# Patient Record
Sex: Male | Born: 1980 | Race: White | Hispanic: No | Smoking: Current every day smoker
Health system: Southern US, Community
[De-identification: ages and names within clinical notes are randomized; demographics above are authoritative.]

## PROBLEM LIST (undated history)

## (undated) DIAGNOSIS — L409 Psoriasis, unspecified: Secondary | ICD-10-CM

## (undated) DIAGNOSIS — F1910 Other psychoactive substance abuse, uncomplicated: Secondary | ICD-10-CM

## (undated) DIAGNOSIS — N200 Calculus of kidney: Secondary | ICD-10-CM

## (undated) DIAGNOSIS — F1020 Alcohol dependence, uncomplicated: Secondary | ICD-10-CM

## (undated) HISTORY — PX: LITHOTRIPSY: SUR834

---

## 2014-06-02 ENCOUNTER — Emergency Department: Payer: Self-pay | Admitting: Emergency Medicine

## 2014-08-31 ENCOUNTER — Emergency Department (HOSPITAL_COMMUNITY)
Admission: EM | Admit: 2014-08-31 | Discharge: 2014-08-31 | Disposition: A | Discharge status: Home / Self Care | Payer: Self-pay | Attending: Emergency Medicine | Admitting: Emergency Medicine

## 2014-08-31 ENCOUNTER — Emergency Department (HOSPITAL_COMMUNITY): Payer: Self-pay

## 2014-08-31 ENCOUNTER — Encounter (HOSPITAL_COMMUNITY): Payer: Self-pay | Admitting: Emergency Medicine

## 2014-08-31 DIAGNOSIS — R059 Cough, unspecified: Secondary | ICD-10-CM

## 2014-08-31 DIAGNOSIS — R1110 Vomiting, unspecified: Secondary | ICD-10-CM | POA: Insufficient documentation

## 2014-08-31 DIAGNOSIS — Z872 Personal history of diseases of the skin and subcutaneous tissue: Secondary | ICD-10-CM | POA: Insufficient documentation

## 2014-08-31 DIAGNOSIS — Z880 Allergy status to penicillin: Secondary | ICD-10-CM | POA: Insufficient documentation

## 2014-08-31 DIAGNOSIS — J40 Bronchitis, not specified as acute or chronic: Secondary | ICD-10-CM

## 2014-08-31 DIAGNOSIS — Z720 Tobacco use: Secondary | ICD-10-CM | POA: Insufficient documentation

## 2014-08-31 DIAGNOSIS — J209 Acute bronchitis, unspecified: Secondary | ICD-10-CM | POA: Insufficient documentation

## 2014-08-31 DIAGNOSIS — R05 Cough: Secondary | ICD-10-CM

## 2014-08-31 HISTORY — DX: Psoriasis, unspecified: L40.9

## 2014-08-31 MED ORDER — ALBUTEROL SULFATE HFA 108 (90 BASE) MCG/ACT IN AERS
2.0000 | INHALATION_SPRAY | RESPIRATORY_TRACT | Status: DC | PRN
  Administered 2014-08-31: 2 via RESPIRATORY_TRACT
  Filled 2014-08-31: qty 6.7

## 2014-08-31 MED ORDER — AZITHROMYCIN 250 MG PO TABS: 250.0000 mg | ORAL_TABLET | Freq: Every day | ORAL | Status: DC

## 2014-08-31 MED ORDER — AZITHROMYCIN 250 MG PO TABS
500.0000 mg | ORAL_TABLET | Freq: Once | ORAL | Status: AC
  Administered 2014-08-31: 500 mg via ORAL
  Filled 2014-08-31: qty 2

## 2014-08-31 MED ORDER — GUAIFENESIN-CODEINE 100-10 MG/5ML PO SOLN: 5.0000 mL | Freq: Three times a day (TID) | ORAL | Status: DC | PRN

## 2014-08-31 NOTE — ED Notes (Signed)
Pt reports dry cough x 2 weeks; reports increased pain in chest when coughing. Cough is nonproductive; reports feeling like phlegm is in chest and throat, unable to cough phlegm up.

## 2014-08-31 NOTE — ED Notes (Signed)
Pt c/o cough and pain with cough x 1 week

## 2014-08-31 NOTE — ED Provider Notes (Signed)
CSN: [id number]     Arrival date & time 08/31/14  1815 History  This chart was scribed for Richard Peliffany Draysen Weygandt, PA-C, working with Richard DivineJohn Wofford, MD by Richard Dillon, ED Scribe. This patient was seen in room TR03C/TR03C and the patient's care was started at 7:15 PM.   Chief Complaint  Patient presents with  . Cough   The history is provided by the patient. No language interpreter was used.   HPI Comments:  Richard Dillon is a 34 y.o. male who presents to the Emergency Department complaining of a dry cough onset two weeks ago that was originally intermittent but has become constant with exertion and at night.  He reports a sensation of phlegm in his throat that he cannot clear with coughing. He also notes associated chest pain that initially resolved when he would cease to cough, but is now present without coughing- worsens with movement and if he pushes on his chest.  He also notes some recent onset mild sore throat and 1 episode of post-tussive emesis today.  OTC allergy and cough medications have not afforded relief.  Patient denies having the flu shot.  Patient denies a history of asthma.  Patient is a current smoker. Patient denies fever, headache, ear pain, generalized body aches. .     Past Medical History  Diagnosis Date  . Psoriasis    History reviewed. No pertinent past surgical history. History reviewed. No pertinent family history. History  Substance Use Topics  . Smoking status: Current Every Day Smoker  . Smokeless tobacco: Not on file  . Alcohol Use: Yes    Review of Systems  Constitutional: Negative for fever.  HENT: Positive for sore throat. Negative for ear pain.   Respiratory: Positive for cough.   Gastrointestinal: Positive for vomiting.  Neurological: Negative for headaches.   Allergies  Amoxicillin and Prednisone  Home Medications   Prior to Admission medications   Medication Sig Start Date End Date Taking? Authorizing Provider  PRESCRIPTION MEDICATION  Medication: Humira.. Patient not sure what dose he takes. Tried calling pharmacy no answer   Yes Historical Provider, MD  azithromycin (ZITHROMAX) 250 MG tablet Take 1 tablet (250 mg total) by mouth daily. Take first 2 tablets together, then 1 every day until finished. 08/31/14   Richard Nadal Neva SeatGreene, PA-C  guaiFENesin-codeine 100-10 MG/5ML syrup Take 5 mLs by mouth 3 (three) times daily as needed for cough. 08/31/14   Richard Taddeo Neva SeatGreene, PA-C   BP 131/72 mmHg  Pulse 67  Temp(Src) 98.1 F (36.7 C) (Oral)  Resp 20  Wt 180 lb 2 oz (81.704 kg)  SpO2 99% Physical Exam  Constitutional: He is oriented to person, place, and time. He appears well-developed and well-nourished. No distress.  HENT:  Head: Normocephalic and atraumatic.  Right Ear: Tympanic membrane and ear canal normal.  Left Ear: Tympanic membrane and ear canal normal.  Mouth/Throat: Uvula is midline and oropharynx is clear and moist.  Eyes: Conjunctivae and EOM are normal.  Neck: Neck supple. No tracheal deviation present.  Cardiovascular: Normal rate.   Pulmonary/Chest: Effort normal. No respiratory distress. He has no decreased breath sounds. He has no wheezes. He has rhonchi (small amount of rhonchi to right lower lung). He has no rales.  +coughing on exam  Abdominal: Bowel sounds are normal. There is no tenderness. There is no rigidity and no guarding.  Musculoskeletal: Normal range of motion.  Neurological: He is alert and oriented to person, place, and time.  Skin: Skin is warm and dry.  Psychiatric: He has a normal mood and affect. His behavior is normal.  Nursing note and vitals reviewed.   ED Course  Procedures (including critical care time)  DIAGNOSTIC STUDIES: Oxygen Saturation is 98% on RA, normal by my interpretation.    COORDINATION OF CARE:  7:26 PM Discussed treatment plan with patient at bedside.  Patient acknowledges and agrees with plan.    Labs Review Labs Reviewed - No data to display  Imaging Review No  results found.   EKG Interpretation None      MDM   Final diagnoses:  Cough  Bronchitis   Pt is over-all well appearing, coughing on exam. No wheezing but mild amount of rhonchi, patients chest xray has resulted normal - cough persisting for 2 weeks now. I will cover with abx for atypical infection and give rx for cough.    34 y.o.Richard Dillon's evaluation in the Emergency Department is complete. It has been determined that no acute conditions requiring further emergency intervention are present at this time. The patient/guardian have been advised of the diagnosis and plan. We have discussed signs and symptoms that warrant return to the ED, such as changes or worsening in symptoms.  Vital signs are stable at discharge. Filed Vitals:   08/31/14 1958  BP: 131/72  Pulse: 67  Temp: 98.1 F (36.7 C)  Resp: 20    Patient/guardian has voiced understanding and agreed to follow-up with the PCP or specialist.  I personally performed the services described in this documentation, which was scribed in my presence. The recorded information has been reviewed and is accurate.    Richard Pel, PA-C 09/02/14 2032  Richard Divine, MD 09/04/14 1004

## 2014-08-31 NOTE — Discharge Instructions (Signed)

## 2015-03-08 ENCOUNTER — Emergency Department: Payer: Self-pay

## 2015-03-08 ENCOUNTER — Emergency Department
Admission: EM | Admit: 2015-03-08 | Discharge: 2015-03-08 | Disposition: A | Discharge status: Home / Self Care | Payer: Self-pay | Attending: Emergency Medicine | Admitting: Emergency Medicine

## 2015-03-08 ENCOUNTER — Encounter: Payer: Self-pay | Admitting: Emergency Medicine

## 2015-03-08 DIAGNOSIS — J209 Acute bronchitis, unspecified: Secondary | ICD-10-CM | POA: Insufficient documentation

## 2015-03-08 DIAGNOSIS — Z880 Allergy status to penicillin: Secondary | ICD-10-CM | POA: Insufficient documentation

## 2015-03-08 DIAGNOSIS — Z720 Tobacco use: Secondary | ICD-10-CM | POA: Insufficient documentation

## 2015-03-08 MED ORDER — ALBUTEROL SULFATE HFA 108 (90 BASE) MCG/ACT IN AERS: 2.0000 | INHALATION_SPRAY | Freq: Four times a day (QID) | RESPIRATORY_TRACT | Status: DC | PRN

## 2015-03-08 MED ORDER — ACETAMINOPHEN-CODEINE #3 300-30 MG PO TABS: 1.0000 | ORAL_TABLET | Freq: Four times a day (QID) | ORAL | Status: DC | PRN

## 2015-03-08 MED ORDER — BENZONATATE 100 MG PO CAPS: 100.0000 mg | ORAL_CAPSULE | Freq: Three times a day (TID) | ORAL | Status: DC | PRN

## 2015-03-08 NOTE — ED Provider Notes (Signed)
[facility], [facility] Emergency Department Provider Note ____________________________________________  Time seen: 1804  I have reviewed the triage vital signs and the nursing notes.  HISTORY  Chief Complaint  Cough  HPI Richard Dillon is a 34 y.o. male ports to the EDfor complaints of continued cough and congestion despite near the completion of a Z-Pak. He still notes some chest tightness and some chest pain since be worse at night. He denies any interim fever at this time. He ports his pain at 8/10 in triage.  Past Medical History  Diagnosis Date  . Psoriasis     There are no active problems to display for this patient.   History reviewed. No pertinent past surgical history.  Current Outpatient Rx  Name  Route  Sig  Dispense  Refill  . acetaminophen-codeine (TYLENOL #3) 300-30 MG tablet   Oral   Take 1 tablet by mouth every 6 (six) hours as needed for moderate pain.   10 tablet   0   . albuterol (PROVENTIL HFA;VENTOLIN HFA) 108 (90 BASE) MCG/ACT inhaler   Inhalation   Inhale 2 puffs into the lungs every 6 (six) hours as needed for wheezing or shortness of breath.   1 Inhaler   0   . azithromycin (ZITHROMAX) 250 MG tablet   Oral   Take 1 tablet (250 mg total) by mouth daily. Take first 2 tablets together, then 1 every day until finished.   6 tablet   0   . benzonatate (TESSALON PERLES) 100 MG capsule   Oral   Take 1 capsule (100 mg total) by mouth 3 (three) times daily as needed for cough (Take 1-2 per dose).   30 capsule   0   . guaiFENesin-codeine 100-10 MG/5ML syrup   Oral   Take 5 mLs by mouth 3 (three) times daily as needed for cough.   100 mL   0   . PRESCRIPTION MEDICATION      Medication: Humira.. Patient not sure what dose he takes. Tried calling pharmacy no answer          Allergies Amoxicillin and Prednisone  No family history on file.  Social History Social History  Substance Use Topics  . Smoking status: Current  Every Day Smoker -- 1.00 packs/day    Types: Cigarettes  . Smokeless tobacco: None  . Alcohol Use: Yes   Review of Systems  Constitutional: Negative for fever. Eyes: Negative for visual changes. ENT: Negative for sore throat. Cardiovascular: Negative for chest pain. Respiratory: Negative for shortness of breath. Reports continued cough. Gastrointestinal: Negative for abdominal pain, vomiting and diarrhea. Genitourinary: Negative for dysuria. Musculoskeletal: Negative for back pain. Skin: Negative for rash. Neurological: Negative for headaches, focal weakness or numbness. ____________________________________________  PHYSICAL EXAM:  VITAL SIGNS: ED Triage Vitals  Enc Vitals Group     BP 03/08/15 1647 131/93 mmHg     Pulse Rate 03/08/15 1647 89     Resp 03/08/15 1647 20     Temp 03/08/15 1647 98.3 F (36.8 C)     Temp Source 03/08/15 1647 Oral     SpO2 03/08/15 1647 97 %     Weight 03/08/15 1647 195 lb (88.451 kg)     Height 03/08/15 1647 6\' 3"  (1.905 m)     Head Cir --      Peak Flow --      Pain Score 03/08/15 1648 8     Pain Loc --      Pain Edu? --  Excl. in GC? --    Constitutional: Alert and oriented. Well appearing and in no distress. Head: Normocephalic and atraumatic.      Eyes: Conjunctivae are normal. PERRL. Normal extraocular movements      Ears: Canals clear. TMs intact bilaterally.   Nose: No congestion/rhinorrhea.   Mouth/Throat: Mucous membranes are moist.   Neck: Supple. No thyromegaly. Hematological/Lymphatic/Immunological: No cervical lymphadenopathy. Cardiovascular: Normal rate, regular rhythm.  Respiratory: Normal respiratory effort. No wheezes/rales/rhonchi. Gastrointestinal: Soft and nontender. No distention. Musculoskeletal: Nontender with normal range of motion in all extremities.  Neurologic:  Normal gait without ataxia. Normal speech and language. No gross focal neurologic deficits are appreciated. Skin:  Skin is warm, dry and  intact. No rash noted. Psychiatric: Mood and affect are normal. Patient exhibits appropriate insight and judgment. ____________________________________________   RADIOLOGY CXR IMPRESSION: Bronchitic changes.  I, Amaury Kuzel, Charlesetta Ivory, personally viewed and evaluated these images (plain radiographs) as part of my medical decision making.  ____________________________________________  INITIAL IMPRESSION / ASSESSMENT AND PLAN / ED COURSE  Patient with what appears to be a resolved pneumonia, with only residual bronchitic changes on x-ray. He is encouraged to complete the azithromycin as Pepcid directed. He will be given a prescription for Tessalon Perles, albuterol inhaler, and Tylenol #3 as needed for chest wall pain and cough control. He will follow-up with his primary care provider for ongoing symptoms. ____________________________________________  FINAL CLINICAL IMPRESSION(S) / ED DIAGNOSES  Final diagnoses:  Acute bronchitis, unspecified organism      Lissa Hoard, PA-C 03/08/15 2251  Phineas Semen, MD 03/09/15 1402

## 2015-03-08 NOTE — ED Notes (Signed)
Pt c/o cough with congestion for the past 2 weeks and has 1 day left on a z_pack, states he is not feeling any better..Marland Kitchen

## 2015-03-08 NOTE — Discharge Instructions (Signed)
Acute Bronchitis Bronchitis is inflammation of the airways that extend from the windpipe into the lungs (bronchi). The inflammation often causes mucus to develop. This leads to a cough, which is the most common symptom of bronchitis.  In acute bronchitis, the condition usually develops suddenly and goes away over time, usually in a couple weeks. Smoking, allergies, and asthma can make bronchitis worse. Repeated episodes of bronchitis may cause further lung problems.  CAUSES Acute bronchitis is most often caused by the same virus that causes a cold. The virus can spread from person to person (contagious) through coughing, sneezing, and touching contaminated objects. SIGNS AND SYMPTOMS   Cough.   Fever.   Coughing up mucus.   Body aches.   Chest congestion.   Chills.   Shortness of breath.   Sore throat.  DIAGNOSIS  Acute bronchitis is usually diagnosed through a physical exam. Your health care provider will also ask you questions about your medical history. Tests, such as chest X-rays, are sometimes done to rule out other conditions.  TREATMENT  Acute bronchitis usually goes away in a couple weeks. Oftentimes, no medical treatment is necessary. Medicines are sometimes given for relief of fever or cough. Antibiotic medicines are usually not needed but may be prescribed in certain situations. In some cases, an inhaler may be recommended to help reduce shortness of breath and control the cough. A cool mist vaporizer may also be used to help thin bronchial secretions and make it easier to clear the chest.  HOME CARE INSTRUCTIONS  Get plenty of rest.   Drink enough fluids to keep your urine clear or pale yellow (unless you have a medical condition that requires fluid restriction). Increasing fluids may help thin your respiratory secretions (sputum) and reduce chest congestion, and it will prevent dehydration.   Take medicines only as directed by your health care provider.  If  you were prescribed an antibiotic medicine, finish it all even if you start to feel better.  Avoid smoking and secondhand smoke. Exposure to cigarette smoke or irritating chemicals will make bronchitis worse. If you are a smoker, consider using nicotine gum or skin patches to help control withdrawal symptoms. Quitting smoking will help your lungs heal faster.   Reduce the chances of another bout of acute bronchitis by washing your hands frequently, avoiding people with cold symptoms, and trying not to touch your hands to your mouth, nose, or eyes.   Keep all follow-up visits as directed by your health care provider.  SEEK MEDICAL CARE IF: Your symptoms do not improve after 1 week of treatment.  SEEK IMMEDIATE MEDICAL CARE IF:  You develop an increased fever or chills.   You have chest pain.   You have severe shortness of breath.  You have bloody sputum.   You develop dehydration.  You faint or repeatedly feel like you are going to pass out.  You develop repeated vomiting.  You develop a severe headache. MAKE SURE YOU:   Understand these instructions.  Will watch your condition.  Will get help right away if you are not doing well or get worse.   This information is not intended to replace advice given to you by your health care provider. Make sure you discuss any questions you have with your health care provider.   Document Released: 06/15/2004 Document Revised: 05/29/2014 Document Reviewed: 10/29/2012 Elsevier Interactive Patient Education 2016 [employer]  Continue the antibiotic as previously prescribed.  Follow-up with your provider as needed.  Take the prescription meds  as directed.

## 2015-03-26 ENCOUNTER — Emergency Department
Admission: EM | Admit: 2015-03-26 | Discharge: 2015-03-26 | Disposition: A | Discharge status: Home / Self Care | Payer: Self-pay | Attending: Emergency Medicine | Admitting: Emergency Medicine

## 2015-03-26 ENCOUNTER — Emergency Department: Payer: Self-pay

## 2015-03-26 ENCOUNTER — Encounter: Payer: Self-pay | Admitting: Emergency Medicine

## 2015-03-26 DIAGNOSIS — Z720 Tobacco use: Secondary | ICD-10-CM | POA: Insufficient documentation

## 2015-03-26 DIAGNOSIS — Z792 Long term (current) use of antibiotics: Secondary | ICD-10-CM | POA: Insufficient documentation

## 2015-03-26 DIAGNOSIS — J9801 Acute bronchospasm: Secondary | ICD-10-CM | POA: Insufficient documentation

## 2015-03-26 DIAGNOSIS — Z880 Allergy status to penicillin: Secondary | ICD-10-CM | POA: Insufficient documentation

## 2015-03-26 LAB — CBC
HCT: 42.2 % (ref 40.0–52.0)
Hemoglobin: 14.4 g/dL (ref 13.0–18.0)
MCH: 30.4 pg (ref 26.0–34.0)
MCHC: 34 g/dL (ref 32.0–36.0)
MCV: 89.4 fL (ref 80.0–100.0)
PLATELETS: 280 10*3/uL (ref 150–440)
RBC: 4.72 MIL/uL (ref 4.40–5.90)
RDW: 13.3 % (ref 11.5–14.5)
WBC: 8.7 10*3/uL (ref 3.8–10.6)

## 2015-03-26 LAB — BASIC METABOLIC PANEL
Anion gap: 4 — ABNORMAL LOW (ref 5–15)
BUN: 14 mg/dL (ref 6–20)
CALCIUM: 9.2 mg/dL (ref 8.9–10.3)
CO2: 27 mmol/L (ref 22–32)
Chloride: 108 mmol/L (ref 101–111)
Creatinine, Ser: 1.07 mg/dL (ref 0.61–1.24)
GLUCOSE: 103 mg/dL — AB (ref 65–99)
POTASSIUM: 3.7 mmol/L (ref 3.5–5.1)
Sodium: 139 mmol/L (ref 135–145)

## 2015-03-26 LAB — TROPONIN I

## 2015-03-26 MED ORDER — PREDNISONE 20 MG PO TABS
ORAL_TABLET | ORAL | Status: AC
  Filled 2015-03-26: qty 3

## 2015-03-26 MED ORDER — ALBUTEROL SULFATE (2.5 MG/3ML) 0.083% IN NEBU
INHALATION_SOLUTION | RESPIRATORY_TRACT | Status: AC
  Filled 2015-03-26: qty 3

## 2015-03-26 MED ORDER — IPRATROPIUM-ALBUTEROL 0.5-2.5 (3) MG/3ML IN SOLN
3.0000 mL | Freq: Once | RESPIRATORY_TRACT | Status: AC
  Administered 2015-03-26: 3 mL via RESPIRATORY_TRACT

## 2015-03-26 MED ORDER — PREDNISONE 50 MG PO TABS: 50.0000 mg | ORAL_TABLET | Freq: Every day | ORAL | Status: DC

## 2015-03-26 MED ORDER — ACETAMINOPHEN 500 MG PO TABS
500.0000 mg | ORAL_TABLET | Freq: Once | ORAL | Status: AC
  Administered 2015-03-26: 500 mg via ORAL

## 2015-03-26 MED ORDER — IPRATROPIUM-ALBUTEROL 0.5-2.5 (3) MG/3ML IN SOLN
RESPIRATORY_TRACT | Status: AC
  Administered 2015-03-26: 3 mL via RESPIRATORY_TRACT
  Filled 2015-03-26: qty 3

## 2015-03-26 MED ORDER — ALBUTEROL SULFATE HFA 108 (90 BASE) MCG/ACT IN AERS: 2.0000 | INHALATION_SPRAY | Freq: Four times a day (QID) | RESPIRATORY_TRACT | Status: DC | PRN

## 2015-03-26 MED ORDER — ALBUTEROL SULFATE (2.5 MG/3ML) 0.083% IN NEBU
2.5000 mg | INHALATION_SOLUTION | Freq: Once | RESPIRATORY_TRACT | Status: AC
  Administered 2015-03-26: 2.5 mg via RESPIRATORY_TRACT

## 2015-03-26 MED ORDER — ACETAMINOPHEN 500 MG PO TABS
ORAL_TABLET | ORAL | Status: AC
  Administered 2015-03-26: 500 mg via ORAL
  Filled 2015-03-26: qty 1

## 2015-03-26 NOTE — ED Notes (Signed)
MD at bedside. 

## 2015-03-26 NOTE — ED Provider Notes (Signed)
[facility] Emergency Department Provider Note  ____________________________________________  Time seen: 2:20 PM  I have reviewed the triage vital signs and the nursing notes.   HISTORY  Chief Complaint Chest Pain and Shortness of Breath    HPI Richard Dillon is a 34 y.o. male reports he is recently recovering from bronchitis. He notes that he has had chest tightness and some discomfort with deep breaths. He reports his cough is mostly improved. Sometimes he feels winded when exerting himself. He denies a history of coronary artery disease. He he has a history of smoking but recently quit. He denies fevers chills. No nausea no vomiting. No diaphoresis. No radiation of discomfort. No recent travel or leg pain or swelling     Past Medical History  Diagnosis Date  . Psoriasis     There are no active problems to display for this patient.   History reviewed. No pertinent past surgical history.  Current Outpatient Rx  Name  Route  Sig  Dispense  Refill  . acetaminophen-codeine (TYLENOL #3) 300-30 MG tablet   Oral   Take 1 tablet by mouth every 6 (six) hours as needed for moderate pain.   10 tablet   0   . albuterol (PROVENTIL HFA;VENTOLIN HFA) 108 (90 BASE) MCG/ACT inhaler   Inhalation   Inhale 2 puffs into the lungs every 6 (six) hours as needed for wheezing or shortness of breath.   1 Inhaler   2   . azithromycin (ZITHROMAX) 250 MG tablet   Oral   Take 1 tablet (250 mg total) by mouth daily. Take first 2 tablets together, then 1 every day until finished.   6 tablet   0   . benzonatate (TESSALON PERLES) 100 MG capsule   Oral   Take 1 capsule (100 mg total) by mouth 3 (three) times daily as needed for cough (Take 1-2 per dose).   30 capsule   0   . guaiFENesin-codeine 100-10 MG/5ML syrup   Oral   Take 5 mLs by mouth 3 (three) times daily as needed for cough.   100 mL   0   . predniSONE (DELTASONE) 50 MG tablet   Oral   Take 1  tablet (50 mg total) by mouth daily with breakfast.   5 tablet   0   . PRESCRIPTION MEDICATION      Medication: Humira.. Patient not sure what dose he takes. Tried calling pharmacy no answer           Allergies Amoxicillin and Prednisone  No family history on file.  Social History Social History  Substance Use Topics  . Smoking status: Current Every Day Smoker -- 1.00 packs/day    Types: Cigarettes  . Smokeless tobacco: None  . Alcohol Use: Yes    Review of Systems  Constitutional: Negative for fever. Eyes: Negative for visual changes. ENT: Negative for sore throat Cardiovascular: As above for chest tightness Respiratory: Mild shortness of breath Gastrointestinal: Negative for abdominal pain, vomiting and diarrhea. Genitourinary: Negative for dysuria. Musculoskeletal: Negative for back pain. Skin: Negative for rash. Neurological: Negative for headaches or focal weakness Psychiatric: No anxiety    ____________________________________________   PHYSICAL EXAM:  VITAL SIGNS: ED Triage Vitals  Enc Vitals Group     BP 03/26/15 1138 131/80 mmHg     Pulse Rate 03/26/15 1138 70     Resp 03/26/15 1138 16     Temp 03/26/15 1138 98.1 F (36.7 C)     Temp Source  03/26/15 1138 Oral     SpO2 03/26/15 1138 96 %     Weight 03/26/15 1138 195 lb (88.451 kg)     Height 03/26/15 1138 6\' 3"  (1.905 m)     Head Cir --      Peak Flow --      Pain Score 03/26/15 1145 8     Pain Loc --      Pain Edu? --      Excl. in GC? --      Constitutional: Alert and oriented. Well appearing and in no distress. Eyes: Conjunctivae are normal.  ENT   Head: Normocephalic and atraumatic.   Mouth/Throat: Mucous membranes are moist. Cardiovascular: Normal rate, regular rhythm. Normal and symmetric distal pulses are present in all extremities. No murmurs, rubs, or gallops. Respiratory: Normal respiratory effort without tachypnea nor retractions. Patient was scattered wheezes  bilaterally Gastrointestinal: Soft and non-tender in all quadrants. No distention. There is no CVA tenderness. Genitourinary: deferred Musculoskeletal: Nontender with normal range of motion in all extremities. No lower extremity tenderness nor edema. Neurologic:  Normal speech and language. No gross focal neurologic deficits are appreciated. Skin:  Skin is warm, dry and intact. No rash noted. Psychiatric: Mood and affect are normal. Patient exhibits appropriate insight and judgment.  ____________________________________________    LABS (pertinent positives/negatives)  Labs Reviewed  BASIC METABOLIC PANEL - Abnormal; Notable for the following:    Glucose, Bld 103 (*)    Anion gap 4 (*)    All other components within normal limits  TROPONIN I  CBC    ____________________________________________   EKG  ED ECG REPORT I, Jene EveryKINNER, Ryleah Miramontes, the attending physician, personally viewed and interpreted this ECG.  Date: 03/26/2015 EKG Time: 11:13 AM Rate: 67 Rhythm: normal sinus rhythm QRS Axis: normal Intervals: normal ST/T Wave abnormalities: normal Conduction Disutrbances: none Narrative Interpretation: unremarkable   ____________________________________________    RADIOLOGY I have personally reviewed any xrays that were ordered on this patient: Chest x-ray unremarkable  ____________________________________________   PROCEDURES  Procedure(s) performed: none  Critical Care performed: none  ____________________________________________   INITIAL IMPRESSION / ASSESSMENT AND PLAN / ED COURSE  Pertinent labs & imaging results that were available during my care of the patient were reviewed by me and considered in my medical decision making (see chart for details).  Patient presents after a bronchitis with wheezing bilaterally. He does have a long history of smoking but suspect acute bronchospasm. He is perk negative. We have treated with albuterol and a DuoNeb which has  successfully improved his symptoms considerably. He is not actually allergic to prednisone and I will prescribe him an albuterol inhaler and prednisone which I think will resolve the symptoms. He knows to return if any change  ____________________________________________   FINAL CLINICAL IMPRESSION(S) / ED DIAGNOSES  Final diagnoses:  Bronchospasm, acute     Jene Everyobert Dream Nodal, MD 03/26/15 1549

## 2015-03-26 NOTE — ED Notes (Signed)
Says he has been tx here for bronchitis recently. Says last couple days he has been having chest pain with sob on and off and today he was at work and it got very bad.  Pt in nad now and speaking without difficulty.  ekg has been completed and pt informed we will be pulling him into triage soon.

## 2015-03-26 NOTE — Discharge Instructions (Signed)
Bronchospasm, Adult  A bronchospasm is a spasm or tightening of the airways going into the lungs. During a bronchospasm breathing becomes more difficult because the airways get smaller. When this happens there can be coughing, a whistling sound when breathing (wheezing), and difficulty breathing. Bronchospasm is often associated with asthma, but not all patients who experience a bronchospasm have asthma.  CAUSES   A bronchospasm is caused by inflammation or irritation of the airways. The inflammation or irritation may be triggered by:   · Allergies (such as to animals, pollen, food, or mold). Allergens that cause bronchospasm may cause wheezing immediately after exposure or many hours later.    · Infection. Viral infections are believed to be the most common cause of bronchospasm.    · Exercise.    · Irritants (such as pollution, cigarette smoke, strong odors, aerosol sprays, and paint fumes).    · Weather changes. Winds increase molds and pollens in the air. Rain refreshes the air by washing irritants out. Cold air may cause inflammation.    · Stress and emotional upset.    SIGNS AND SYMPTOMS   · Wheezing.    · Excessive nighttime coughing.    · Frequent or severe coughing with a simple cold.    · Chest tightness.    · Shortness of breath.    DIAGNOSIS   Bronchospasm is usually diagnosed through a history and physical exam. Tests, such as chest X-rays, are sometimes done to look for other conditions.  TREATMENT   · Inhaled medicines can be given to open up your airways and help you breathe. The medicines can be given using either an inhaler or a nebulizer machine.  · Corticosteroid medicines may be given for severe bronchospasm, usually when it is associated with asthma.  HOME CARE INSTRUCTIONS   · Always have a plan prepared for seeking medical care. Know when to call your health care provider and local emergency services (911 in the [place] Know where you can access local emergency care.  · Only take medicines as  directed by your health care provider.  · If you were prescribed an inhaler or nebulizer machine, ask your health care provider to explain how to use it correctly. Always use a spacer with your inhaler if you were given one.  · It is necessary to remain calm during an attack. Try to relax and breathe more slowly.   · Control your home environment in the following ways:      Change your heating and air conditioning filter at least once a month.      Limit your use of fireplaces and wood stoves.    Do not smoke and do not allow smoking in your home.      Avoid exposure to perfumes and fragrances.      Get rid of pests (such as roaches and mice) and their droppings.      Throw away plants if you see mold on them.      Keep your house clean and dust free.      Replace carpet with wood, tile, or vinyl flooring. Carpet can trap dander and dust.      Use allergy-proof pillows, mattress covers, and box spring covers.      Wash bed sheets and blankets every week in hot water and dry them in a dryer.      Use blankets that are made of polyester or cotton.      Wash hands frequently.  SEEK MEDICAL CARE IF:   · You have muscle aches.    · You have chest pain.    · The sputum changes from clear or   white to yellow, green, gray, or bloody.    · The sputum you cough up gets thicker.    · There are problems that may be related to the medicine you are given, such as a rash, itching, swelling, or trouble breathing.    SEEK IMMEDIATE MEDICAL CARE IF:   · You have worsening wheezing and coughing even after taking your prescribed medicines.    · You have increased difficulty breathing.    · You develop severe chest pain.  MAKE SURE YOU:   · Understand these instructions.  · Will watch your condition.  · Will get help right away if you are not doing well or get worse.     This information is not intended to replace advice given to you by your health care provider. Make sure you discuss any questions you have with your health care  provider.     Document Released: 05/11/2003 Document Revised: 05/29/2014 Document Reviewed: 10/28/2012  Elsevier Interactive Patient Education ©2016 [employer]

## 2015-05-06 ENCOUNTER — Emergency Department: Payer: Self-pay

## 2015-05-06 ENCOUNTER — Emergency Department
Admission: EM | Admit: 2015-05-06 | Discharge: 2015-05-06 | Disposition: A | Discharge status: Home / Self Care | Payer: Self-pay | Attending: Emergency Medicine | Admitting: Emergency Medicine

## 2015-05-06 ENCOUNTER — Encounter: Payer: Self-pay | Admitting: Emergency Medicine

## 2015-05-06 DIAGNOSIS — Z880 Allergy status to penicillin: Secondary | ICD-10-CM | POA: Insufficient documentation

## 2015-05-06 DIAGNOSIS — W11XXXA Fall on and from ladder, initial encounter: Secondary | ICD-10-CM | POA: Insufficient documentation

## 2015-05-06 DIAGNOSIS — S20229A Contusion of unspecified back wall of thorax, initial encounter: Secondary | ICD-10-CM | POA: Insufficient documentation

## 2015-05-06 DIAGNOSIS — Z79899 Other long term (current) drug therapy: Secondary | ICD-10-CM | POA: Insufficient documentation

## 2015-05-06 DIAGNOSIS — Y9389 Activity, other specified: Secondary | ICD-10-CM | POA: Insufficient documentation

## 2015-05-06 DIAGNOSIS — Z792 Long term (current) use of antibiotics: Secondary | ICD-10-CM | POA: Insufficient documentation

## 2015-05-06 DIAGNOSIS — Y998 Other external cause status: Secondary | ICD-10-CM | POA: Insufficient documentation

## 2015-05-06 DIAGNOSIS — Z7952 Long term (current) use of systemic steroids: Secondary | ICD-10-CM | POA: Insufficient documentation

## 2015-05-06 DIAGNOSIS — F17210 Nicotine dependence, cigarettes, uncomplicated: Secondary | ICD-10-CM | POA: Insufficient documentation

## 2015-05-06 DIAGNOSIS — Y9289 Other specified places as the place of occurrence of the external cause: Secondary | ICD-10-CM | POA: Insufficient documentation

## 2015-05-06 MED ORDER — OXYCODONE-ACETAMINOPHEN 5-325 MG PO TABS
2.0000 | ORAL_TABLET | Freq: Once | ORAL | Status: AC
  Administered 2015-05-06: 2 via ORAL
  Filled 2015-05-06: qty 2

## 2015-05-06 MED ORDER — OXYCODONE-ACETAMINOPHEN 5-325 MG PO TABS: 1.0000 | ORAL_TABLET | ORAL | Status: DC | PRN

## 2015-05-06 NOTE — ED Notes (Signed)
Pt states he was working on a ladder and the wind blew the ladder and he fell backward and hit his back on a "picket" on fence, states "it knocked the wind out of me", c/o mid and lower back pain

## 2015-05-06 NOTE — ED Notes (Signed)
Patient fell from a ladder 3 ft above ground.  Patient landed on top of a picket on his lower back.

## 2015-05-06 NOTE — ED Provider Notes (Signed)
[facility] Emergency Department Provider Note ____________________________________________  Time seen: Approximately 1:43 PM  I have reviewed the triage vital signs and the nursing notes.   HISTORY  Chief Complaint Fall and Back Pain  HPI Richard Dillon is a 34 y.o. male patient is here after falling off a ladder approximately 3 feet above ground. Patient states that he lost his balance when when was blowing and fell backwards and hit 1 peak it that was going to be part of the fence. Patient states "it knocked the wind out of me". Currently he is having mid to lower back pain. He denies any paresthesias in his lower extremities. He denies any head injury or loss consciousness during this event. Patient states that this occurred approximately 1 hour prior to arrival. He rates his pain 8 out of 10.   Past Medical History  Diagnosis Date  . Psoriasis     There are no active problems to display for this patient.   History reviewed. No pertinent past surgical history.  Current Outpatient Rx  Name  Route  Sig  Dispense  Refill  . acetaminophen-codeine (TYLENOL #3) 300-30 MG tablet   Oral   Take 1 tablet by mouth every 6 (six) hours as needed for moderate pain.   10 tablet   0   . albuterol (PROVENTIL HFA;VENTOLIN HFA) 108 (90 BASE) MCG/ACT inhaler   Inhalation   Inhale 2 puffs into the lungs every 6 (six) hours as needed for wheezing or shortness of breath.   1 Inhaler   2   . azithromycin (ZITHROMAX) 250 MG tablet   Oral   Take 1 tablet (250 mg total) by mouth daily. Take first 2 tablets together, then 1 every day until finished.   6 tablet   0   . benzonatate (TESSALON PERLES) 100 MG capsule   Oral   Take 1 capsule (100 mg total) by mouth 3 (three) times daily as needed for cough (Take 1-2 per dose).   30 capsule   0   . guaiFENesin-codeine 100-10 MG/5ML syrup   Oral   Take 5 mLs by mouth 3 (three) times daily as needed for cough.  100 mL   0   . oxyCODONE-acetaminophen (PERCOCET) 5-325 MG tablet   Oral   Take 1 tablet by mouth every 4 (four) hours as needed for severe pain.   20 tablet   0   . predniSONE (DELTASONE) 50 MG tablet   Oral   Take 1 tablet (50 mg total) by mouth daily with breakfast.   5 tablet   0   . PRESCRIPTION MEDICATION      Medication: Humira.. Patient not sure what dose he takes. Tried calling pharmacy no answer           Allergies Amoxicillin and Prednisone  No family history on file.  Social History Social History  Substance Use Topics  . Smoking status: Current Every Day Smoker -- 1.00 packs/day    Types: Cigarettes  . Smokeless tobacco: None  . Alcohol Use: Yes    Review of Systems Constitutional: No fever/chills Eyes: No visual changes. ENT: No sore throat. Cardiovascular: Denies chest pain. Respiratory: Denies shortness of breath. Gastrointestinal: No abdominal pain.  No nausea, no vomiting.   Musculoskeletal: Positive for back pain. Skin: Negative for rash. Neurological: Negative for headaches, focal weakness or numbness.  10-point ROS otherwise negative.  ____________________________________________   PHYSICAL EXAM:  VITAL SIGNS: ED Triage Vitals  Enc Vitals Group  BP 05/06/15 1332 141/93 mmHg     Pulse Rate 05/06/15 1332 79     Resp 05/06/15 1332 18     Temp 05/06/15 1332 97.8 F (36.6 C)     Temp Source 05/06/15 1332 Oral     SpO2 05/06/15 1332 97 %     Weight 05/06/15 1332 190 lb (86.183 kg)     Height 05/06/15 1332 6\' 3"  (1.905 m)     Head Cir --      Peak Flow --      Pain Score 05/06/15 1332 8     Pain Loc --      Pain Edu? --      Excl. in GC? --     Constitutional: Alert and oriented. Well appearing and in no acute distress. Eyes: Conjunctivae are normal. PERRL. EOMI. Head: Atraumatic. Nose: No congestion/rhinnorhea. Neck: No stridor.  No cervical tenderness on palpation. Range of motion is within normal limits without any  restrictions. Cardiovascular: Normal rate, regular rhythm. Grossly normal heart sounds.  Good peripheral circulation. Respiratory: Normal respiratory effort.  No retractions. Lungs CTAB. Gastrointestinal: Soft and nontender. No distention.  Musculoskeletal: Moves upper and lower extremities without any difficulty, grip strength and lower extremity strength is within normal limits. There is marked tenderness on palpation of the lumbar spine without any gross deformity. There is no soft tissue swelling present. There is one single abrasion noted approximately L1 L2 area that is very superficial and is not bleeding at this time. Patient is ambulatory in the ER without any assistance. Neurologic:  Normal speech and language. No gross focal neurologic deficits are appreciated. No gait instability. Skin:  Skin is warm, dry and intact. She abrasion above and muscle skeletal. Psychiatric: Mood and affect are normal. Speech and behavior are normal.  ____________________________________________   LABS (all labs ordered are listed, but only abnormal results are displayed)  Labs Reviewed - No data to display  RADIOLOGY X-ray lumbar spine shows no acute bony abnormality. Patient does have nonobstructing right renal stone. There are some degenerative changes. ____________________________________________   PROCEDURES  Procedure(s) performed: None  Critical Care performed: No  ____________________________________________   INITIAL IMPRESSION / ASSESSMENT AND PLAN / ED COURSE  Pertinent labs & imaging results that were available during my care of the patient were reviewed by me and considered in my medical decision making (see chart for details).  Patient was placed on Percocet as needed for pain. [facility] or return to the emergency room if any severe worsening of his symptoms. ____________________________________________   FINAL CLINICAL IMPRESSION(S) / ED  DIAGNOSES  Final diagnoses:  Contusion, back, unspecified laterality, initial encounter      Tommi RumpsRhonda L Jazzlene Huot, PA-C 05/06/15 1552  Emily FilbertJonathan E Williams, MD 05/07/15 [phone]

## 2015-05-06 NOTE — Discharge Instructions (Signed)
Contusion A contusion is a deep bruise. Contusions happen when an injury causes bleeding under the skin. Symptoms of bruising include pain, swelling, and discolored skin. The skin may turn blue, purple, or yellow. HOME CARE   Rest the injured area.  If told, put ice on the injured area.  Put ice in a plastic bag.  Place a towel between your skin and the bag.  Leave the ice on for 20 minutes, 2-3 times per day.  If told, put light pressure (compression) on the injured area using an elastic bandage. Make sure the bandage is not too tight. Remove it and put it back on as told by your doctor.  If possible, raise (elevate) the injured area above the level of your heart while you are sitting or lying down.  Take over-the-counter and prescription medicines only as told by your doctor. GET HELP IF:  Your symptoms do not get better after several days of treatment.  Your symptoms get worse.  You have trouble moving the injured area. GET HELP RIGHT AWAY IF:   You have very bad pain.  You have a loss of feeling (numbness) in a hand or foot.  Your hand or foot turns pale or cold.   This information is not intended to replace advice given to you by your health care provider. Make sure you discuss any questions you have with your health care provider.   Document Released: 10/25/2007 Document Revised: 01/27/2015 Document Reviewed: 09/23/2014 Elsevier Interactive Patient Education 2016 [employer]   Ice to back as needed. Percocet as needed for pain. Do not plan on driving while taking this medication. [facility] if any continued problems.

## 2015-06-07 ENCOUNTER — Encounter: Payer: Self-pay | Admitting: *Deleted

## 2015-06-07 ENCOUNTER — Emergency Department: Payer: Self-pay

## 2015-06-07 ENCOUNTER — Emergency Department
Admission: EM | Admit: 2015-06-07 | Discharge: 2015-06-07 | Disposition: A | Discharge status: Home / Self Care | Payer: Self-pay | Attending: Emergency Medicine | Admitting: Emergency Medicine

## 2015-06-07 DIAGNOSIS — S40011A Contusion of right shoulder, initial encounter: Secondary | ICD-10-CM | POA: Insufficient documentation

## 2015-06-07 DIAGNOSIS — Y9289 Other specified places as the place of occurrence of the external cause: Secondary | ICD-10-CM | POA: Insufficient documentation

## 2015-06-07 DIAGNOSIS — Y998 Other external cause status: Secondary | ICD-10-CM | POA: Insufficient documentation

## 2015-06-07 DIAGNOSIS — W108XXA Fall (on) (from) other stairs and steps, initial encounter: Secondary | ICD-10-CM | POA: Insufficient documentation

## 2015-06-07 DIAGNOSIS — F17210 Nicotine dependence, cigarettes, uncomplicated: Secondary | ICD-10-CM | POA: Insufficient documentation

## 2015-06-07 DIAGNOSIS — Y9389 Activity, other specified: Secondary | ICD-10-CM | POA: Insufficient documentation

## 2015-06-07 DIAGNOSIS — Z880 Allergy status to penicillin: Secondary | ICD-10-CM | POA: Insufficient documentation

## 2015-06-07 MED ORDER — IBUPROFEN 800 MG PO TABS: 800.0000 mg | ORAL_TABLET | Freq: Three times a day (TID) | ORAL | Status: DC | PRN

## 2015-06-07 MED ORDER — KETOROLAC TROMETHAMINE 60 MG/2ML IM SOLN
60.0000 mg | Freq: Once | INTRAMUSCULAR | Status: AC
  Administered 2015-06-07: 60 mg via INTRAMUSCULAR
  Filled 2015-06-07: qty 2

## 2015-06-07 MED ORDER — OXYCODONE-ACETAMINOPHEN 5-325 MG PO TABS
2.0000 | ORAL_TABLET | Freq: Once | ORAL | Status: AC
  Administered 2015-06-07: 2 via ORAL
  Filled 2015-06-07: qty 2

## 2015-06-07 MED ORDER — CYCLOBENZAPRINE HCL 10 MG PO TABS: 10.0000 mg | ORAL_TABLET | Freq: Three times a day (TID) | ORAL | Status: AC | PRN

## 2015-06-07 MED ORDER — OXYCODONE-ACETAMINOPHEN 5-325 MG PO TABS: 1.0000 | ORAL_TABLET | ORAL | Status: DC | PRN

## 2015-06-07 NOTE — Discharge Instructions (Signed)

## 2015-06-07 NOTE — ED Provider Notes (Signed)
[facility] Emergency Department Provider Note  ____________________________________________  Time seen: Approximately 9:14 AM  I have reviewed the triage vital signs and the nursing notes.   HISTORY  Chief Complaint Shoulder Pain   HPI Richard Dillon is a 35 y.o. male presents with complaints of right shoulder pain. Patient states that he fell down the steps morning and his shoulder on the wall. Complains of difficulty lifting or raising his shoulder.   Past Medical History  Diagnosis Date  . Psoriasis   . Psoriasis     There are no active problems to display for this patient.   History reviewed. No pertinent past surgical history.  Current Outpatient Rx  Name  Route  Sig  Dispense  Refill  . albuterol (PROVENTIL HFA;VENTOLIN HFA) 108 (90 BASE) MCG/ACT inhaler   Inhalation   Inhale 2 puffs into the lungs every 6 (six) hours as needed for wheezing or shortness of breath.   1 Inhaler   2   . cyclobenzaprine (FLEXERIL) 10 MG tablet   Oral   Take 1 tablet (10 mg total) by mouth every 8 (eight) hours as needed for muscle spasms.   30 tablet   1   . ibuprofen (ADVIL,MOTRIN) 800 MG tablet   Oral   Take 1 tablet (800 mg total) by mouth every 8 (eight) hours as needed.   30 tablet   0   . oxyCODONE-acetaminophen (ROXICET) 5-325 MG tablet   Oral   Take 1-2 tablets by mouth every 4 (four) hours as needed for severe pain.   15 tablet   0   . PRESCRIPTION MEDICATION      Medication: Humira.. Patient not sure what dose he takes. Tried calling pharmacy no answer           Allergies Amoxicillin and Prednisone  No family history on file.  Social History Social History  Substance Use Topics  . Smoking status: Current Every Day Smoker -- 1.00 packs/day    Types: Cigarettes  . Smokeless tobacco: None  . Alcohol Use: No    Review of Systems Constitutional: No fever/chills Eyes: No visual changes. ENT: No sore  throat. Cardiovascular: Denies chest pain. Respiratory: Denies shortness of breath. Gastrointestinal: No abdominal pain.  No nausea, no vomiting.  No diarrhea.  No constipation. Genitourinary: Negative for dysuria. Musculoskeletal: Positive for right shoulder pain. Skin: Negative for rash. Neurological: Negative for headaches, focal weakness or numbness.  10-point ROS otherwise negative.  ____________________________________________   PHYSICAL EXAM:  VITAL SIGNS: ED Triage Vitals  Enc Vitals Group     BP 06/07/15 0900 146/81 mmHg     Pulse Rate 06/07/15 0900 80     Resp 06/07/15 0900 20     Temp 06/07/15 0900 97.7 F (36.5 C)     Temp Source 06/07/15 0900 Oral     SpO2 06/07/15 0900 97 %     Weight 06/07/15 0900 190 lb (86.183 kg)     Height 06/07/15 0900 6\' 3"  (1.905 m)     Head Cir --      Peak Flow --      Pain Score 06/07/15 0900 9     Pain Loc --      Pain Edu? --      Excl. in GC? --     Constitutional: Alert and oriented. Well appearing and in no acute distress. Eyes: Conjunctivae are normal. PERRL. EOMI. Head: Atraumatic. Nose: No congestion/rhinnorhea. Mouth/Throat: Mucous membranes are moist.  Oropharynx non-erythematous. Neck:  No stridor.  No cervical spinal tenderness to palpation. Cardiovascular: Normal rate, regular rhythm. Grossly normal heart sounds.  Good peripheral circulation. Respiratory: Normal respiratory effort.  No retractions. Lungs CTAB. Musculoskeletal: Right shoulder with limited range of motion. Increased pain with abduction and abduction and extension. Distally neurovascularly intact. Point tenderness noted to the lateral aspect of the right shoulder. Neurologic:  Normal speech and language. No gross focal neurologic deficits are appreciated. No gait instability. Skin:  Skin is warm, dry and intact. No rash noted. No ecchymosis or bruising noted. Psychiatric: Mood and affect are normal. Speech and behavior are  normal.  ____________________________________________   LABS (all labs ordered are listed, but only abnormal results are displayed)  Labs Reviewed - No data to display   RADIOLOGY  No acute osseous findings. ____________________________________________   PROCEDURES  Procedure(s) performed: None  Critical Care performed: No  ____________________________________________   INITIAL IMPRESSION / ASSESSMENT AND PLAN / ED COURSE  Pertinent labs & imaging results that were available during my care of the patient were reviewed by me and considered in my medical decision making (see chart for details).  Status post fall with right shoulder contusion. Rx given for Percocet 5/325, Motrin 800 mg, Flexeril 10 mg 3 times a day. Excuse 2 days. Patient follow-up PCP or return when necessary any worsening symptomology. Patient voices no other emergency medical complaints at this visit. ____________________________________________   FINAL CLINICAL IMPRESSION(S) / ED DIAGNOSES  Final diagnoses:  Shoulder contusion, right, initial encounter      Evangeline DakinCharles M Teria Khachatryan, PA-C 06/07/15 1012  Myrna Blazeravid Matthew Schaevitz, MD 06/07/15 [phone]

## 2015-06-07 NOTE — ED Notes (Signed)
Pt tripped and fell down a few stairs, pt landed on right shoulder, complaining of shoulder pain and back pain, pt denies LOC

## 2015-06-09 ENCOUNTER — Emergency Department
Admission: EM | Admit: 2015-06-09 | Discharge: 2015-06-09 | Disposition: A | Discharge status: Home / Self Care | Payer: Self-pay | Attending: Emergency Medicine | Admitting: Emergency Medicine

## 2015-06-09 ENCOUNTER — Encounter: Payer: Self-pay | Admitting: Emergency Medicine

## 2015-06-09 DIAGNOSIS — F17210 Nicotine dependence, cigarettes, uncomplicated: Secondary | ICD-10-CM | POA: Insufficient documentation

## 2015-06-09 DIAGNOSIS — Y9289 Other specified places as the place of occurrence of the external cause: Secondary | ICD-10-CM | POA: Insufficient documentation

## 2015-06-09 DIAGNOSIS — Y9389 Activity, other specified: Secondary | ICD-10-CM | POA: Insufficient documentation

## 2015-06-09 DIAGNOSIS — S61211A Laceration without foreign body of left index finger without damage to nail, initial encounter: Secondary | ICD-10-CM | POA: Insufficient documentation

## 2015-06-09 DIAGNOSIS — S61219A Laceration without foreign body of unspecified finger without damage to nail, initial encounter: Secondary | ICD-10-CM

## 2015-06-09 DIAGNOSIS — Z23 Encounter for immunization: Secondary | ICD-10-CM | POA: Insufficient documentation

## 2015-06-09 DIAGNOSIS — Y288XXA Contact with other sharp object, undetermined intent, initial encounter: Secondary | ICD-10-CM | POA: Insufficient documentation

## 2015-06-09 DIAGNOSIS — Z880 Allergy status to penicillin: Secondary | ICD-10-CM | POA: Insufficient documentation

## 2015-06-09 DIAGNOSIS — Y990 Civilian activity done for income or pay: Secondary | ICD-10-CM | POA: Insufficient documentation

## 2015-06-09 MED ORDER — OXYCODONE-ACETAMINOPHEN 5-325 MG PO TABS: 1.0000 | ORAL_TABLET | ORAL | Status: DC | PRN

## 2015-06-09 MED ORDER — LIDOCAINE HCL (PF) 1 % IJ SOLN
INTRAMUSCULAR | Status: AC
  Filled 2015-06-09: qty 5

## 2015-06-09 MED ORDER — TETANUS-DIPHTH-ACELL PERTUSSIS 5-2.5-18.5 LF-MCG/0.5 IM SUSP
0.5000 mL | Freq: Once | INTRAMUSCULAR | Status: AC
  Administered 2015-06-09: 0.5 mL via INTRAMUSCULAR

## 2015-06-09 MED ORDER — TETANUS-DIPHTH-ACELL PERTUSSIS 5-2.5-18.5 LF-MCG/0.5 IM SUSP
INTRAMUSCULAR | Status: AC
  Filled 2015-06-09: qty 0.5

## 2015-06-09 NOTE — Discharge Instructions (Signed)
Laceration Care, Adult  A laceration is a cut that goes through all layers of the skin. The cut also goes into the tissue that is right under the skin. Some cuts heal on their own. Others need to be closed with stitches (sutures), staples, skin adhesive strips, or wound glue. Taking care of your cut lowers your risk of infection and helps your cut to heal better.  HOW TO TAKE CARE OF YOUR CUT  For stitches or staples:  · Keep the wound clean and dry.  · If you were given a bandage (dressing), you should change it at least one time per day or as told by your doctor. You should also change it if it gets wet or dirty.  · Keep the wound completely dry for the first 24 hours or as told by your doctor. After that time, you may take a shower or a bath. However, make sure that the wound is not soaked in water until after the stitches or staples have been removed.  · Clean the wound one time each day or as told by your doctor:    Wash the wound with soap and water.    Rinse the wound with water until all of the soap comes off.    Pat the wound dry with a clean towel. Do not rub the wound.  · After you clean the wound, put a thin layer of antibiotic ointment on it as told by your doctor. This ointment:    Helps to prevent infection.    Keeps the bandage from sticking to the wound.  · Have your stitches or staples removed as told by your doctor.  If your doctor used skin adhesive strips:   · Keep the wound clean and dry.  · If you were given a bandage, you should change it at least one time per day or as told by your doctor. You should also change it if it gets dirty or wet.  · Do not get the skin adhesive strips wet. You can take a shower or a bath, but be careful to keep the wound dry.  · If the wound gets wet, pat it dry with a clean towel. Do not rub the wound.  · Skin adhesive strips fall off on their own. You can trim the strips as the wound heals. Do not remove any strips that are still stuck to the wound. They will  fall off after a while.  If your doctor used wound glue:  · Try to keep your wound dry, but you may briefly wet it in the shower or bath. Do not soak the wound in water, such as by swimming.  · After you take a shower or a bath, gently pat the wound dry with a clean towel. Do not rub the wound.  · Do not do any activities that will make you really sweaty until the skin glue has fallen off on its own.  · Do not apply liquid, cream, or ointment medicine to your wound while the skin glue is still on.  · If you were given a bandage, you should change it at least one time per day or as told by your doctor. You should also change it if it gets dirty or wet.  · If a bandage is placed over the wound, do not let the tape for the bandage touch the skin glue.  · Do not pick at the glue. The skin glue usually stays on for 5-10 days. Then, it   falls off of the skin.  General Instructions   · To help prevent scarring, make sure to cover your wound with sunscreen whenever you are outside after stitches are removed, after adhesive strips are removed, or when wound glue stays in place and the wound is healed. Make sure to wear a sunscreen of at least 30 SPF.  · Take over-the-counter and prescription medicines only as told by your doctor.  · If you were given antibiotic medicine or ointment, take or apply it as told by your doctor. Do not stop using the antibiotic even if your wound is getting better.  · Do not scratch or pick at the wound.  · Keep all follow-up visits as told by your doctor. This is important.  · Check your wound every day for signs of infection. Watch for:    Redness, swelling, or pain.    Fluid, blood, or pus.  · Raise (elevate) the injured area above the level of your heart while you are sitting or lying down, if possible.  GET HELP IF:  · You got a tetanus shot and you have any of these problems at the injection site:    Swelling.    Very bad pain.    Redness.    Bleeding.  · You have a fever.  · A wound that was  closed breaks open.  · You notice a bad smell coming from your wound or your bandage.  · You notice something coming out of the wound, such as wood or glass.  · Medicine does not help your pain.  · You have more redness, swelling, or pain at the site of your wound.  · You have fluid, blood, or pus coming from your wound.  · You notice a change in the color of your skin near your wound.  · You need to change the bandage often because fluid, blood, or pus is coming from the wound.  · You start to have a new rash.  · You start to have numbness around the wound.  GET HELP RIGHT AWAY IF:  · You have very bad swelling around the wound.  · Your pain suddenly gets worse and is very bad.  · You notice painful lumps near the wound or on skin that is anywhere on your body.  · You have a red streak going away from your wound.  · The wound is on your hand or foot and you cannot move a finger or toe like you usually can.  · The wound is on your hand or foot and you notice that your fingers or toes look pale or bluish.     This information is not intended to replace advice given to you by your health care provider. Make sure you discuss any questions you have with your health care provider.     Document Released: 10/25/2007 Document Revised: 09/22/2014 Document Reviewed: 05/04/2014  Elsevier Interactive Patient Education ©2016 [employer] Inc[employer]

## 2015-06-09 NOTE — ED Provider Notes (Signed)
[facility] Emergency Department Provider Note  ____________________________________________  Time seen: Approximately 11:28 AM  I have reviewed the triage vital signs and the nursing notes.   HISTORY  Chief Complaint Laceration    HPI Richard Dillon is a 35 y.o. male resents for evaluation of laceration to his left index finger. Patient states he cut it on a piece of metal work. Patient states last tetanus is unknown at this time. Denies any other complaints. Denies any numbness or tingling to his fingers.   Past Medical History  Diagnosis Date  . Psoriasis   . Psoriasis     There are no active problems to display for this patient.   History reviewed. No pertinent past surgical history.  Current Outpatient Rx  Name  Route  Sig  Dispense  Refill  . albuterol (PROVENTIL HFA;VENTOLIN HFA) 108 (90 BASE) MCG/ACT inhaler   Inhalation   Inhale 2 puffs into the lungs every 6 (six) hours as needed for wheezing or shortness of breath.   1 Inhaler   2   . cyclobenzaprine (FLEXERIL) 10 MG tablet   Oral   Take 1 tablet (10 mg total) by mouth every 8 (eight) hours as needed for muscle spasms.   30 tablet   1   . ibuprofen (ADVIL,MOTRIN) 800 MG tablet   Oral   Take 1 tablet (800 mg total) by mouth every 8 (eight) hours as needed.   30 tablet   0   . oxyCODONE-acetaminophen (ROXICET) 5-325 MG tablet   Oral   Take 1-2 tablets by mouth every 4 (four) hours as needed for severe pain.   15 tablet   0   . PRESCRIPTION MEDICATION      Medication: Humira.. Patient not sure what dose he takes. Tried calling pharmacy no answer           Allergies Amoxicillin and Prednisone  History reviewed. No pertinent family history.  Social History Social History  Substance Use Topics  . Smoking status: Current Every Day Smoker -- 1.00 packs/day    Types: Cigarettes  . Smokeless tobacco: None  . Alcohol Use: No    Review of  Systems Constitutional: No fever/chills Eyes: No visual changes. ENT: No sore throat. Cardiovascular: Denies chest pain. Respiratory: Denies shortness of breath. Gastrointestinal: No abdominal pain.  No nausea, no vomiting.  No diarrhea.  No constipation. Genitourinary: Negative for dysuria. Musculoskeletal: Negative for back pain. Skin: Positive for laceration to left index finger. Neurological: Negative for headaches, focal weakness or numbness.  10-point ROS otherwise negative.  ____________________________________________   PHYSICAL EXAM:  VITAL SIGNS: ED Triage Vitals  Enc Vitals Group     BP 06/09/15 1053 133/79 mmHg     Pulse Rate 06/09/15 1053 71     Resp 06/09/15 1053 20     Temp 06/09/15 1053 98.1 F (36.7 C)     Temp Source 06/09/15 1053 Oral     SpO2 06/09/15 1053 100 %     Weight 06/09/15 1053 190 lb (86.183 kg)     Height 06/09/15 1053  (1.905 m)     Head Cir --      Peak Flow --      Pain Score 06/09/15 1054 8     Pain Loc --      Pain Edu? --      Excl. in GC? --     Constitutional: Alert and oriented. Well appearing and in no acute distress. Cardiovascular: Normal rate, regular  rhythm. Grossly normal heart sounds.  Good peripheral circulation. Respiratory: Normal respiratory effort.  No retractions. Lungs CTAB. Musculoskeletal: No lower extremity tenderness nor edema.  No joint effusions. Neurologic:  Normal speech and language. No gross focal neurologic deficits are appreciated. No gait instability. Skin:  2 linear lacerations along the palmar aspect of the left index finger. Distally neurovascularly intact. Psychiatric: Mood and affect are normal. Speech and behavior are normal.  ____________________________________________   LABS (all labs ordered are listed, but only abnormal results are displayed)  Labs Reviewed - No data to display ____________________________________________    PROCEDURES  Procedure(s) performed:  Yes  LACERATION REPAIR Performed by: Evangeline Dakin Authorized by: Evangeline Dakin Consent: Verbal consent obtained. Risks and benefits: risks, benefits and alternatives were discussed Consent given by: patient Patient identity confirmed: provided demographic data Prepped and Draped in normal sterile fashion Wound explored  Laceration Location: Left index finger palmar aspect  Laceration Length: 2 cm  No Foreign Bodies seen or palpated  Anesthesia: local infiltration  Local anesthetic: lidocaine 1 % without epinephrine digital block performed   Anesthetic total: 5 ml  Irrigation method: syringe Amount of cleaning: standard  Skin closure: 4-0 Vicryl   Number of sutures: 5  Technique: Simple interrupted   Patient tolerance: Patient tolerated the procedure well with no immediate complications. Critical Care performed: No  ____________________________________________   INITIAL IMPRESSION / ASSESSMENT AND PLAN / ED COURSE  Pertinent labs & imaging results that were available during my care of the patient were reviewed by me and considered in my medical decision making (see chart for details).  Laceration to left index finger. Procedure as noted above. Patient voices no other emergency medical complaints at this time. Sutures to be removed in 7 days. ____________________________________________   FINAL CLINICAL IMPRESSION(S) / ED DIAGNOSES  Final diagnoses:  Finger laceration, initial encounter      Evangeline Dakin, PA-C 06/09/15 1311  Arnaldo Natal, MD 06/09/15 [phone]

## 2015-06-09 NOTE — ED Notes (Signed)
Reports cut left index finger with blade at work while cutting a piece of foam.  Bleeding controlled.  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

## 2015-06-09 NOTE — ED Notes (Signed)
Pt has a small laceration to left index finger.  Pt cut it on a piece of metal at work.  Pt states not WC.  Bleeding controlled.

## 2015-08-12 ENCOUNTER — Emergency Department: Payer: Self-pay

## 2015-08-12 ENCOUNTER — Encounter: Payer: Self-pay | Admitting: Emergency Medicine

## 2015-08-12 ENCOUNTER — Emergency Department
Admission: EM | Admit: 2015-08-12 | Discharge: 2015-08-12 | Disposition: A | Discharge status: Home / Self Care | Payer: Self-pay | Attending: Emergency Medicine | Admitting: Emergency Medicine

## 2015-08-12 DIAGNOSIS — L409 Psoriasis, unspecified: Secondary | ICD-10-CM | POA: Insufficient documentation

## 2015-08-12 DIAGNOSIS — R195 Other fecal abnormalities: Secondary | ICD-10-CM | POA: Insufficient documentation

## 2015-08-12 DIAGNOSIS — N201 Calculus of ureter: Secondary | ICD-10-CM | POA: Insufficient documentation

## 2015-08-12 DIAGNOSIS — F17210 Nicotine dependence, cigarettes, uncomplicated: Secondary | ICD-10-CM | POA: Insufficient documentation

## 2015-08-12 DIAGNOSIS — R1031 Right lower quadrant pain: Secondary | ICD-10-CM

## 2015-08-12 DIAGNOSIS — K921 Melena: Secondary | ICD-10-CM

## 2015-08-12 LAB — CBC
HEMATOCRIT: 42.4 % (ref 40.0–52.0)
Hemoglobin: 14.5 g/dL (ref 13.0–18.0)
MCH: 30.2 pg (ref 26.0–34.0)
MCHC: 34.1 g/dL (ref 32.0–36.0)
MCV: 88.6 fL (ref 80.0–100.0)
PLATELETS: 299 10*3/uL (ref 150–440)
RBC: 4.79 MIL/uL (ref 4.40–5.90)
RDW: 13 % (ref 11.5–14.5)
WBC: 9.7 10*3/uL (ref 3.8–10.6)

## 2015-08-12 LAB — URINALYSIS COMPLETE WITH MICROSCOPIC (ARMC ONLY)
BACTERIA UA: NONE SEEN
Bilirubin Urine: NEGATIVE
GLUCOSE, UA: 50 mg/dL — AB
KETONES UR: NEGATIVE mg/dL
Leukocytes, UA: NEGATIVE
NITRITE: NEGATIVE
Protein, ur: NEGATIVE mg/dL
SPECIFIC GRAVITY, URINE: 1.023 (ref 1.005–1.030)
Squamous Epithelial / LPF: NONE SEEN
pH: 6 (ref 5.0–8.0)

## 2015-08-12 LAB — COMPREHENSIVE METABOLIC PANEL
ALBUMIN: 4 g/dL (ref 3.5–5.0)
ALT: 25 U/L (ref 17–63)
AST: 30 U/L (ref 15–41)
Alkaline Phosphatase: 87 U/L (ref 38–126)
Anion gap: 7 (ref 5–15)
BUN: 14 mg/dL (ref 6–20)
CHLORIDE: 106 mmol/L (ref 101–111)
CO2: 24 mmol/L (ref 22–32)
CREATININE: 0.98 mg/dL (ref 0.61–1.24)
Calcium: 9.1 mg/dL (ref 8.9–10.3)
GFR calc Af Amer: >60 mL/min (ref >60)
GFR calc non Af Amer: >60 mL/min (ref >60)
GLUCOSE: 107 mg/dL — AB (ref 65–99)
POTASSIUM: 3.5 mmol/L (ref 3.5–5.1)
Sodium: 137 mmol/L (ref 135–145)
Total Bilirubin: 0.4 mg/dL (ref 0.3–1.2)
Total Protein: 7.3 g/dL (ref 6.5–8.1)

## 2015-08-12 MED ORDER — IBUPROFEN 800 MG PO TABS: 800.0000 mg | ORAL_TABLET | Freq: Three times a day (TID) | ORAL | Status: DC | PRN

## 2015-08-12 MED ORDER — TAMSULOSIN HCL 0.4 MG PO CAPS: 0.4000 mg | ORAL_CAPSULE | Freq: Every day | ORAL | Status: AC

## 2015-08-12 MED ORDER — IIOPAMIDOL (ISOVUE-250) INJECTION 51%
50.0000 mL | Freq: Once | INTRAVENOUS | Status: AC | PRN
  Administered 2015-08-12: 25 mL via INTRAVENOUS

## 2015-08-12 MED ORDER — IIOPAMIDOL (ISOVUE-250) INJECTION 51%: 100.0000 mL | Freq: Once | INTRAVENOUS | Status: DC | PRN

## 2015-08-12 MED ORDER — OXYCODONE-ACETAMINOPHEN 5-325 MG PO TABS
1.0000 | ORAL_TABLET | Freq: Once | ORAL | Status: AC
  Administered 2015-08-12: 1 via ORAL
  Filled 2015-08-12: qty 1

## 2015-08-12 MED ORDER — ONDANSETRON HCL 4 MG PO TABS: 4.0000 mg | ORAL_TABLET | Freq: Three times a day (TID) | ORAL | Status: AC | PRN

## 2015-08-12 MED ORDER — SODIUM CHLORIDE 0.9 % IV BOLUS (SEPSIS)
1000.0000 mL | Freq: Once | INTRAVENOUS | Status: AC
  Administered 2015-08-12: 1000 mL via INTRAVENOUS

## 2015-08-12 MED ORDER — OXYCODONE-ACETAMINOPHEN 5-325 MG PO TABS: 1.0000 | ORAL_TABLET | ORAL | Status: AC | PRN

## 2015-08-12 MED ORDER — IBUPROFEN 800 MG PO TABS
800.0000 mg | ORAL_TABLET | Freq: Once | ORAL | Status: AC
  Administered 2015-08-12: 800 mg via ORAL
  Filled 2015-08-12: qty 1

## 2015-08-12 MED ORDER — ONDANSETRON HCL 4 MG/2ML IJ SOLN
4.0000 mg | Freq: Once | INTRAMUSCULAR | Status: AC
  Administered 2015-08-12: 4 mg via INTRAVENOUS
  Filled 2015-08-12: qty 2

## 2015-08-12 MED ORDER — HYDROMORPHONE HCL 1 MG/ML IJ SOLN
1.0000 mg | Freq: Once | INTRAMUSCULAR | Status: AC
  Administered 2015-08-12: 1 mg via INTRAVENOUS
  Filled 2015-08-12: qty 1

## 2015-08-12 MED ORDER — IOPAMIDOL (ISOVUE-300) INJECTION 61%
100.0000 mL | Freq: Once | INTRAVENOUS | Status: AC | PRN
  Administered 2015-08-12: 100 mL via INTRAVENOUS

## 2015-08-12 NOTE — ED Provider Notes (Signed)
[facility] Emergency Department Provider Note   ____________________________________________  Time seen: Approximately 7:40pm I have reviewed the triage vital signs and the triage nursing note.  HISTORY  Chief Complaint Blood In Stools and Abdominal Pain   Historian Patient  HPI Richard Dillon is a 35 y.o. male with a history of psoriasis for which he is on Humira, as well as a history of kidney stones, is here for right lower quadrant pain.Patient states he thought initially it might be due to a kidney stone, but different location. Typically fills kidney stone pain in his flank, and this is in his right lower quadrant. No testicular or penis pain. No fevers. He's had nausea without vomiting. No diarrhea. He did strain for bowel movements today and had 2 episodes of bright red blood per rectum, small to moderate amount. He's been having some blood with stools for a couple months, has not been evaluated for this. Next the next line no known history of hemorrhoids. No change in stools. No report of black stools. No epigastric pain. No vomiting blood.  Past Medical History  Diagnosis Date  . Psoriasis   . Psoriasis     There are no active problems to display for this patient.   Past Surgical History  Procedure Laterality Date  . Lithotripsy      Current Outpatient Rx  Name  Route  Sig  Dispense  Refill  . cyclobenzaprine (FLEXERIL) 10 MG tablet   Oral   Take 1 tablet (10 mg total) by mouth every 8 (eight) hours as needed for muscle spasms.   30 tablet   1   . ibuprofen (ADVIL,MOTRIN) 800 MG tablet   Oral   Take 1 tablet (800 mg total) by mouth every 8 (eight) hours as needed.   30 tablet   0   . ondansetron (ZOFRAN) 4 MG tablet   Oral   Take 1 tablet (4 mg total) by mouth every 8 (eight) hours as needed for nausea or vomiting.   10 tablet   0   . oxyCODONE-acetaminophen (ROXICET) 5-325 MG tablet   Oral   Take 1 tablet by mouth every 4  (four) hours as needed for severe pain.   10 tablet   0   . tamsulosin (FLOMAX) 0.4 MG CAPS capsule   Oral   Take 1 capsule (0.4 mg total) by mouth daily.   7 capsule   0     Allergies Amoxicillin and Prednisone  No family history on file.  Social History Social History  Substance Use Topics  . Smoking status: Current Every Day Smoker -- 1.00 packs/day    Types: Cigarettes  . Smokeless tobacco: None  . Alcohol Use: No    Review of Systems  Constitutional: Negative for fever. Eyes: Negative for visual changes. ENT: Negative for sore throat. Cardiovascular: Negative for chest pain. Respiratory: Negative for shortness of breath. Gastrointestinal: As per history of present illness. Genitourinary: Negative for dysuria. Musculoskeletal: Negative for back pain. Skin: Negative for rash. Neurological: Negative for headache. 10 point Review of Systems otherwise negative ____________________________________________   PHYSICAL EXAM:  VITAL SIGNS: ED Triage Vitals  Enc Vitals Group     BP 08/12/15 1717 131/72 mmHg     Pulse Rate 08/12/15 1717 86     Resp 08/12/15 1717 18     Temp 08/12/15 1717 98.5 F (36.9 C)     Temp Source 08/12/15 1717 Oral     SpO2 08/12/15 1717 97 %  Weight 08/12/15 1717 195 lb (88.451 kg)     Height 08/12/15 1717  (1.905 m)     Head Cir --      Peak Flow --      Pain Score 08/12/15 1718 8     Pain Loc --      Pain Edu? --      Excl. in GC? --      Constitutional: Alert and oriented. Well appearing overall but in a significant amount of pain. HEENT   Head: Normocephalic and atraumatic.      Eyes: Conjunctivae are normal. PERRL. Normal extraocular movements.      Ears:         Nose: No congestion/rhinnorhea.   Mouth/Throat: Mucous membranes are moist.   Neck: No stridor. Cardiovascular/Chest: Normal rate, regular rhythm.  No murmurs, rubs, or gallops. Respiratory: Normal respiratory effort without tachypnea nor  retractions. Breath sounds are clear and equal bilaterally. No wheezes/rales/rhonchi. Gastrointestinal: Soft. No distention, no guarding, no rebound. Moderate tenderness to palpation in the right lower quadrant. Genitourinary/rectal: Small nonthrombosed external hemorrhoid. Nontender rectal exam. Secretions with red streaks, which is strongly heme positive. Musculoskeletal: Nontender with normal range of motion in all extremities. No joint effusions.  No lower extremity tenderness.  No edema. Neurologic:  Normal speech and language. No gross or focal neurologic deficits are appreciated. Skin:  Skin is warm, dry and intact. No rash noted. Psychiatric: Mood and affect are normal. Speech and behavior are normal. Patient exhibits appropriate insight and judgment.  ____________________________________________   EKG I, Governor Rooks, MD, the attending physician have personally viewed and interpreted all ECGs.  79 bpm. Normal sinus rhythm. Incomplete right bundle branch block. Narrow QRS. Normal axis. Normal ST and T-wave ____________________________________________  LABS (pertinent positives/negatives)  Comprehensive metabolic panel without significant abnormalities CBC within normal limits Urinalysis  too numerous to count red blood cells, 0-5 white blood cells  ____________________________________________  RADIOLOGY All Xrays were viewed by me. Imaging interpreted by Radiologist.  CT abdomen pelvis with contrast:   FINDINGS: There is minimal atelectasis in the lung bases.  No focal liver abnormality is identified. There is no biliary dilatation. The gallbladder, spleen, adrenal glands, and pancreas have an unremarkable enhanced appearance. There are several nonobstructing right renal calculi which measure up to 5 mm in size. One or 2 punctate nonobstructing left renal calculi are present. No ureteral calculi or ureteral dilatation is identified.  There is no evidence of bowel  obstruction. The appendix is unremarkable.  The bladder and prostate are unremarkable. A duplicated IVC is noted. The aorta is normal in caliber. No free fluid or enlarged lymph nodes are identified. No acute osseous abnormality is seen.  IMPRESSION: __________________________________________  PROCEDURES  Procedure(s) performed: None  Critical Care performed: None  ____________________________________________   ED COURSE / ASSESSMENT AND PLAN  Pertinent labs & imaging results that were available during my care of the patient were reviewed by me and considered in my medical decision making (see chart for details).   In terms of the complaint of bright red blood per rectum, sound a small amount likely consistent with hemorrhoids especially in his age. He does not have any symptoms making me acutely concerned about colon cancer.  In terms of the right lower quadrant pain, the patient initially felt like it might be kidney stone pain, and I am somewhat suspicious of this to, however given as tender as he is on abdominal palpation, I do feel a CT scan is warranted  to rule out other causes of intra-abdominal pain. This is very different from prior kidney stones to him.  CT scan shows normal appendix and no acute emergency intra-abdominal findings. This scan was performed with contrast to make sure not to miss infectious type symptoms, but because of this ureterolithiasis may be minimized are not seen. He does have kidney stones seen, but no ureterolithiasis noted.  His urinalysis was positive for hematuria. At this point I do suspect his symptoms are from a kidney stone that was not visualized on CT. Neck side I will discharge him with symptomatic medications for kidney stone, and refer him to primary care and urology follow-up.  In terms of the small amount of bright red blood with forced bowel movements, I suspect this is likely from bleeding internal hemorrhoids. I will refer him for  GI follow-up.     CONSULTATIONS:   None   Patient / Family / Caregiver informed of clinical course, medical decision-making process, and agree with plan.   I discussed return precautions, follow-up instructions, and discharged instructions with patient and/or family.   ___________________________________________   FINAL CLINICAL IMPRESSION(S) / ED DIAGNOSES   Final diagnoses:  RLQ abdominal pain  Ureterolithiasis  Bloody stools              Note: This dictation was prepared with Dragon dictation. Any transcriptional errors that result from this process are unintentional   Governor Rooks, MD 08/12/15 2057

## 2015-08-12 NOTE — ED Notes (Signed)
Pt presents to ED with blood in stools and abdominal pain. Pt states has noticed blood in stool for two months but today blood filled the toilet. Pt states RLQ pain that radiates to back is new today.

## 2015-08-12 NOTE — Discharge Instructions (Signed)
You were evaluated for right-sided abdominal pain and I suspect this is coming from a kidney stone. Your CT scan was performed with contrast, so it limits evaluation of stone in the ureter which is what typically causes kidney stone symptoms. He did have blood in the urine which is consistent with passing a kidney stone through the ureter.  There is a blood work is reassuring. You are being sent home with medications to help with kidney stone including Flomax, ibuprofen, Zofran, and oxycodone.  We also discussed passing blood with stools over the past several months, which I suspect is likely coming from bleeding hemorrhoids. I recommend you follow with primary care physician, and also with gastroenterology, Dr.Rein's office number is provided.  Return to the emergency department for any new or worsening condition including worsening pain, black stools, dizziness or passing out, fever, or any other symptoms concerning to you.   Kidney Stones Kidney stones (urolithiasis) are solid masses that form inside your kidneys. The intense pain is caused by the stone moving through the kidney, ureter, bladder, and urethra (urinary tract). When the stone moves, the ureter starts to spasm around the stone. The stone is usually passed in your pee (urine).  HOME CARE  Drink enough fluids to keep your pee clear or pale yellow. This helps to get the stone out.  Take a 24-hour pee (urine) sample as told by your doctor. You may need to take another sample every 6-12 months.  Strain all pee through the provided strainer. Do not pee without peeing through the strainer, not even once. If you pee the stone out, catch it in the strainer. The stone may be as small as a grain of salt. Take this to your doctor. This will help your doctor figure out what you can do to try to prevent more kidney stones.  Only take medicine as told by your doctor.  Make changes to your daily diet as told by your doctor. You may be told  to:  Limit how much salt you eat.  Eat 5 or more servings of fruits and vegetables each day.  Limit how much meat, poultry, fish, and eggs you eat.  Keep all follow-up visits as told by your doctor. This is important.  Get follow-up X-rays as told by your doctor. GET HELP IF: You have pain that gets worse even if you have been taking pain medicine. GET HELP RIGHT AWAY IF:   Your pain does not get better with medicine.  You have a fever or shaking chills.  Your pain increases and gets worse over 18 hours.  You have new belly (abdominal) pain.  You feel faint or pass out.  You are unable to pee.   This information is not intended to replace advice given to you by your health care provider. Make sure you discuss any questions you have with your health care provider.   Document Released: 10/25/2007 Document Revised: 01/27/2015 Document Reviewed: 10/09/2012 Elsevier Interactive Patient Education [employer]2016 [employer] Inc.

## 2016-04-13 IMAGING — CR DG LUMBAR SPINE 2-3V
1 series · 3 of 3 positions shown · non-contrast
Comparison: None.

CLINICAL DATA: Acute lower back pain after falling off ladder 1
week ago.

EXAM:
LUMBAR SPINE - 2-3 VIEW

[Series 1: dxr lumbar spine ap and lateral · 0.14mm/px · 3 of 3 slices shown]
[im 1/3]
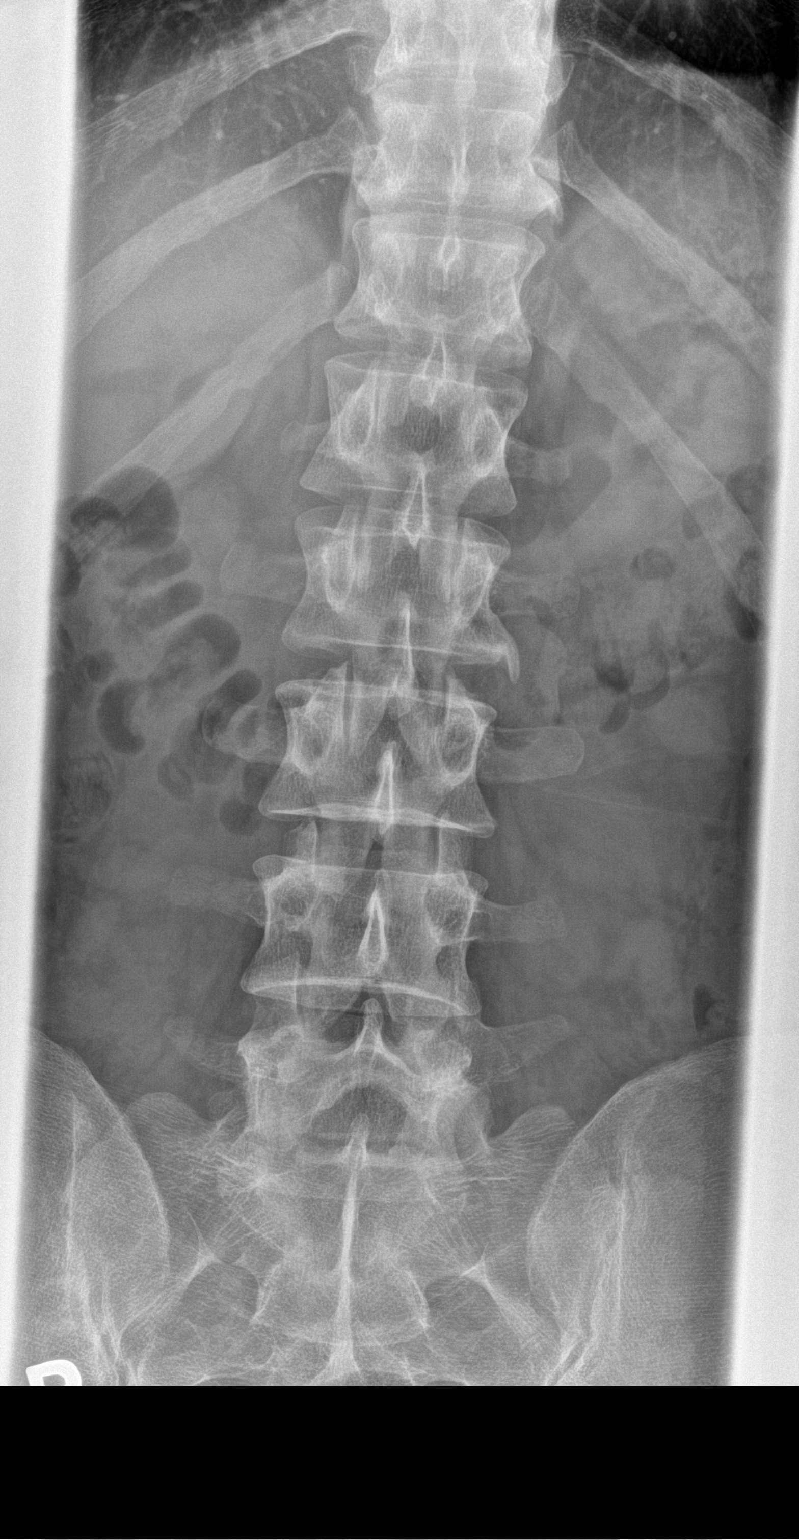
[im 2/3]
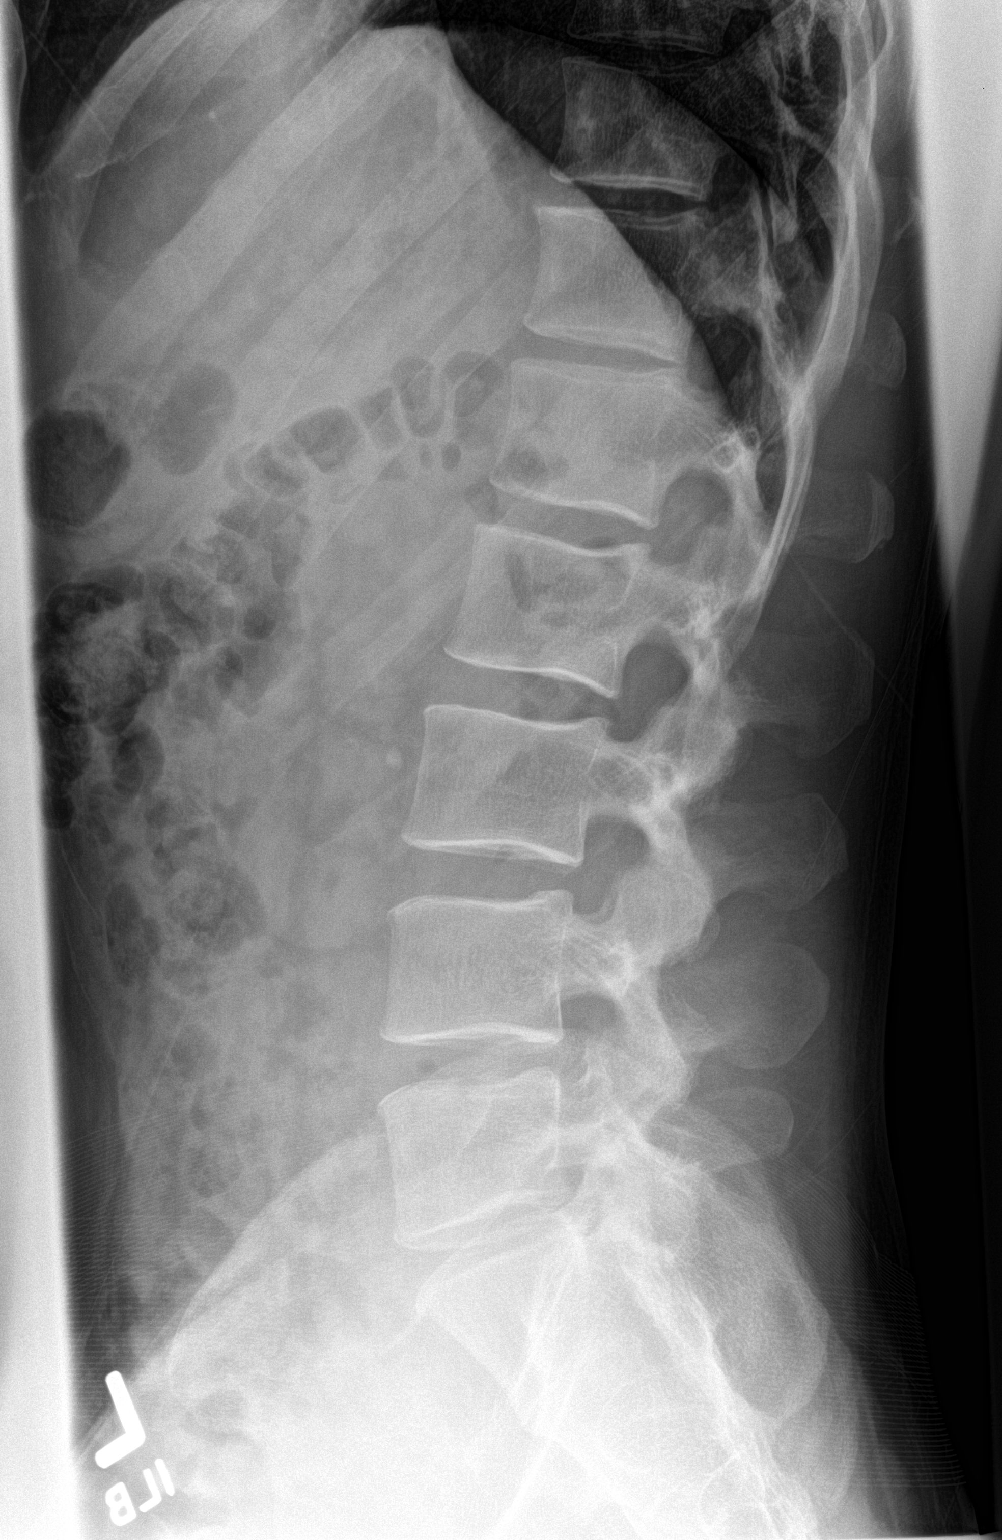
[im 3/3]
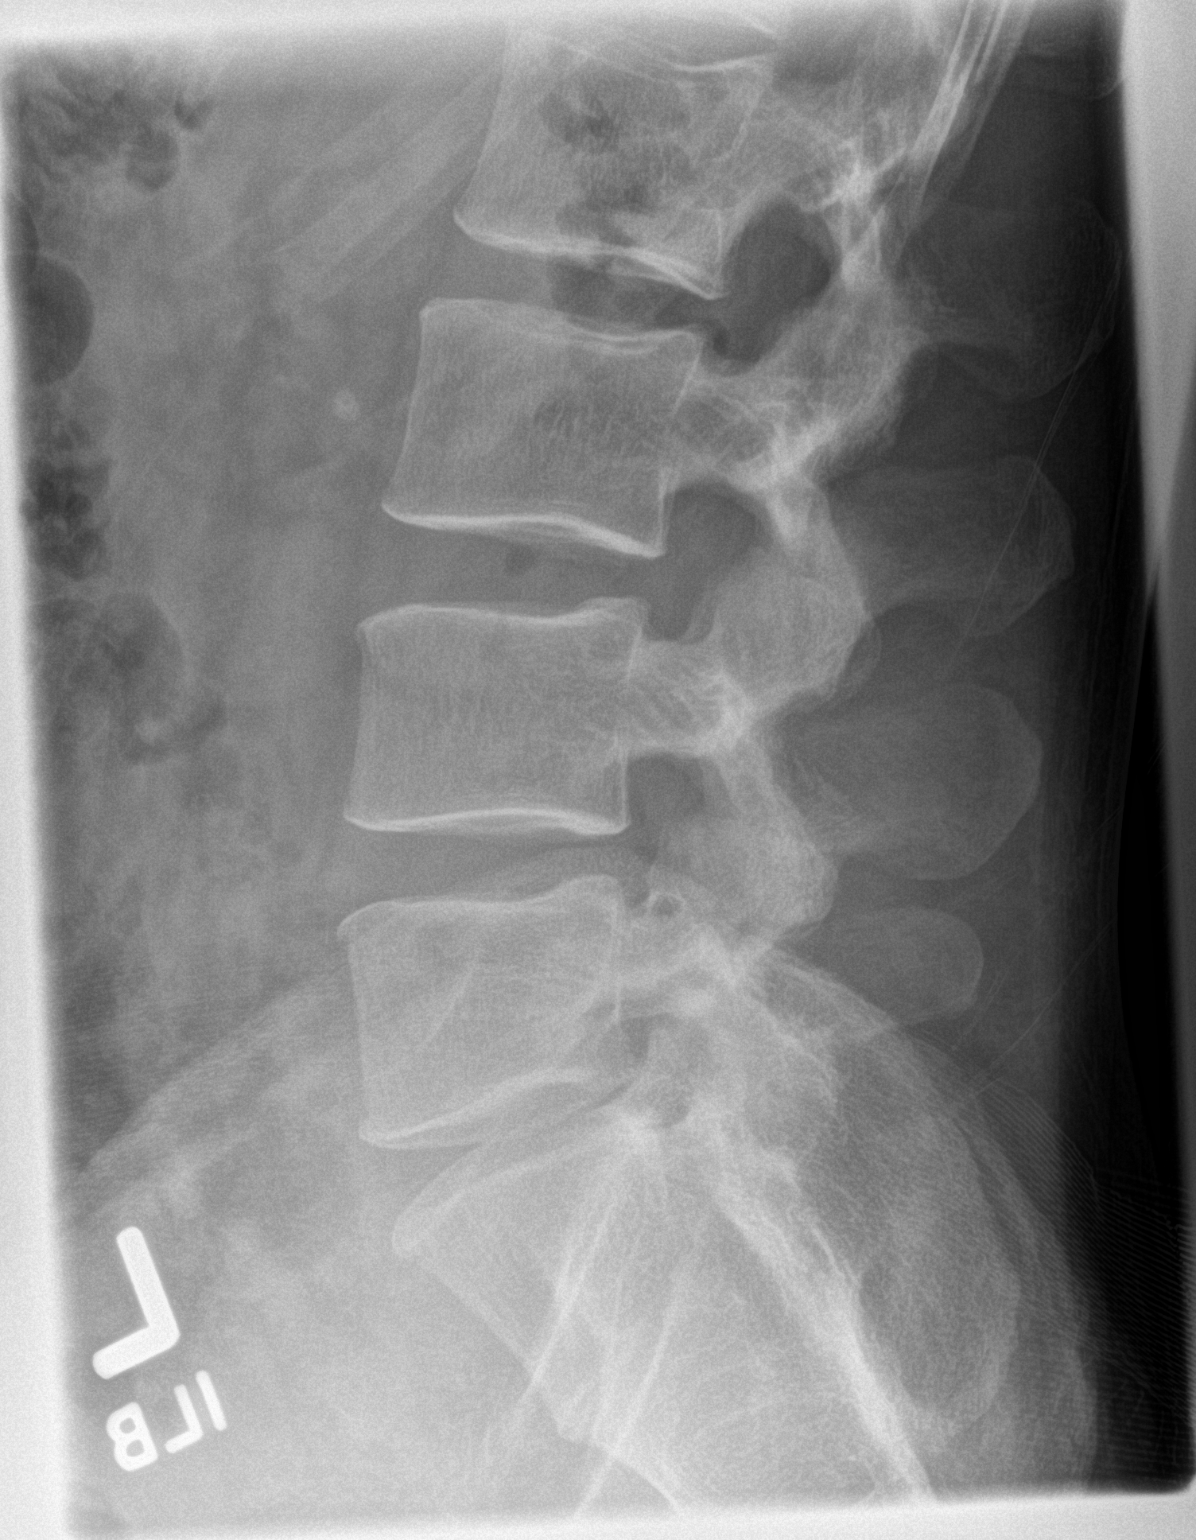

[3 of 3 positions shown; findings below may reference images not displayed]

FINDINGS: There is no evidence of lumbar spine fracture. Alignment is normal.
Intervertebral disc spaces are maintained.
IMPRESSION: Normal lumbar spine.

## 2016-06-18 ENCOUNTER — Encounter (HOSPITAL_BASED_OUTPATIENT_CLINIC_OR_DEPARTMENT_OTHER): Payer: Self-pay | Admitting: Emergency Medicine

## 2016-06-18 ENCOUNTER — Emergency Department (HOSPITAL_BASED_OUTPATIENT_CLINIC_OR_DEPARTMENT_OTHER)
Admission: EM | Admit: 2016-06-18 | Discharge: 2016-06-18 | Disposition: A | Discharge status: Home / Self Care | Payer: Self-pay | Attending: Emergency Medicine | Admitting: Emergency Medicine

## 2016-06-18 DIAGNOSIS — Z4800 Encounter for change or removal of nonsurgical wound dressing: Secondary | ICD-10-CM | POA: Insufficient documentation

## 2016-06-18 DIAGNOSIS — Z5189 Encounter for other specified aftercare: Secondary | ICD-10-CM

## 2016-06-18 DIAGNOSIS — F17210 Nicotine dependence, cigarettes, uncomplicated: Secondary | ICD-10-CM | POA: Insufficient documentation

## 2016-06-18 HISTORY — DX: Other psychoactive substance abuse, uncomplicated: F19.10

## 2016-06-18 HISTORY — DX: Alcohol dependence, uncomplicated: F10.20

## 2016-06-18 HISTORY — DX: Calculus of kidney: N20.0

## 2016-06-18 NOTE — ED Notes (Signed)
Pt given d/c instructions as per chart. Verbalizes understanding. No questions. 

## 2016-06-18 NOTE — ED Provider Notes (Signed)
[facility] Provider Note   CSN: [id number] Arrival date & time: 06/18/16  1329     History   Chief Complaint Chief Complaint  Patient presents with  . Wound Check    HPI Richard Dillon is a 36 y.o. male.  HPI here for evaluation of check. Patient reports he had an abscess drained to the left side of his head, behind his ear several days ago. States "just wanted to get checked". He denies any pain, fevers or chills, drainage or other problems with the wound.  Past Medical History:  Diagnosis Date  . Alcoholism (HCC)   . Drug abuse   . Kidney calculus   . Psoriasis   . Psoriasis     There are no active problems to display for this patient.   History reviewed. No pertinent surgical history.     Home Medications    Prior to Admission medications   Medication Sig Start Date End Date Taking? Authorizing Provider  cyclobenzaprine (FLEXERIL) 10 MG tablet Take 1 tablet (10 mg total) by mouth every 8 (eight) hours as needed for muscle spasms. 06/07/15   Charmayne Sheerharles M Beers, PA-C  ibuprofen (ADVIL,MOTRIN) 800 MG tablet Take 1 tablet (800 mg total) by mouth every 8 (eight) hours as needed. 08/12/15   Governor Rooksebecca Lord, MD  ondansetron (ZOFRAN) 4 MG tablet Take 1 tablet (4 mg total) by mouth every 8 (eight) hours as needed for nausea or vomiting. 08/12/15   Governor Rooksebecca Lord, MD  oxyCODONE-acetaminophen (ROXICET) 5-325 MG tablet Take 1 tablet by mouth every 4 (four) hours as needed for severe pain. 08/12/15   Governor Rooksebecca Lord, MD  tamsulosin (FLOMAX) 0.4 MG CAPS capsule Take 1 capsule (0.4 mg total) by mouth daily. 08/12/15   Governor Rooksebecca Lord, MD    Family History No family history on file.  Social History Social History  Substance Use Topics  . Smoking status: Current Every Day Smoker    Packs/day: 1.00    Types: Cigarettes  . Smokeless tobacco: Never Used  . Alcohol use No     Allergies   Amoxicillin and Prednisone   Review of Systems Review of Systems See history of  present illness  Physical Exam Updated Vital Signs BP 114/67 (BP Location: Right Arm)   Pulse 81   Temp 97.8 F (36.6 C) (Oral)   Resp 16   Ht 6\' 3"  (1.905 m)   Wt 79.4 kg   SpO2 100%   BMI 21.87 kg/m   Physical Exam  Constitutional: He is oriented to person, place, and time. He appears well-developed and well-nourished.  HENT:  Head: Normocephalic and atraumatic.  Right Ear: External ear normal.  Left Ear: External ear normal.  Mouth/Throat: Oropharynx is clear and moist.  Well healing incision posterior left auricle. No evidence of cellulitis. Nontender.  Eyes: Conjunctivae are normal. Pupils are equal, round, and reactive to light. Right eye exhibits no discharge. Left eye exhibits no discharge. No scleral icterus.  Neck: Normal range of motion. Neck supple.  Cardiovascular: Normal rate, regular rhythm and normal heart sounds.   Pulmonary/Chest: Effort normal. No respiratory distress.  Musculoskeletal: Normal range of motion. He exhibits no tenderness.  Neurological: He is alert and oriented to person, place, and time.  Cranial Nerves II-XII grossly intact  Skin: Skin is warm and dry. No rash noted.  Psychiatric: He has a normal mood and affect.  Nursing note and vitals reviewed.    ED Treatments / Results  Labs (all labs ordered are listed,  but only abnormal results are displayed) Labs Reviewed - No data to display  EKG  EKG Interpretation None       Radiology No results found.  Procedures Procedures (including critical care time)  Medications Ordered in ED Medications - No data to display   Initial Impression / Assessment and Plan / ED Course  I have reviewed the triage vital signs and the nursing notes.  Pertinent labs & imaging results that were available during my care of the patient were reviewed by me and considered in my medical decision making (see chart for details).     Here for wound check. Wound appears to be healing well, no evidence  of infection. Follow-up with PCP as needed.  Final Clinical Impressions(s) / ED Diagnoses   Final diagnoses:  Visit for wound check    New Prescriptions New Prescriptions   No medications on file     Joycie Peek, PA-C 06/18/16 1601    Alvira Monday, MD 06/20/16 1709

## 2016-06-18 NOTE — Discharge Instructions (Signed)
You may follow-up with your doctor as needed. Your wound appears to be healing well. Return for any new or worsening symptoms.

## 2016-06-18 NOTE — ED Triage Notes (Signed)
Is living at [facility] and was admitted last night and was told to come for check of healed abscess to scalp . Finished antibiotics for abscess, scabbed area noted without s/s of infection

## 2016-06-29 ENCOUNTER — Emergency Department (HOSPITAL_COMMUNITY): Payer: Self-pay

## 2016-06-29 ENCOUNTER — Encounter (HOSPITAL_COMMUNITY): Payer: Self-pay | Admitting: Emergency Medicine

## 2016-06-29 ENCOUNTER — Emergency Department (HOSPITAL_COMMUNITY)
Admission: EM | Admit: 2016-06-29 | Discharge: 2016-06-29 | Disposition: A | Discharge status: Home / Self Care | Payer: Self-pay | Attending: Emergency Medicine | Admitting: Emergency Medicine

## 2016-06-29 DIAGNOSIS — R109 Unspecified abdominal pain: Secondary | ICD-10-CM

## 2016-06-29 DIAGNOSIS — F17210 Nicotine dependence, cigarettes, uncomplicated: Secondary | ICD-10-CM | POA: Insufficient documentation

## 2016-06-29 DIAGNOSIS — R1031 Right lower quadrant pain: Secondary | ICD-10-CM | POA: Insufficient documentation

## 2016-06-29 LAB — CBC WITH DIFFERENTIAL/PLATELET
Basophils Absolute: 0 10*3/uL (ref 0.0–0.1)
Basophils Relative: 0 %
Eosinophils Absolute: 0.2 10*3/uL (ref 0.0–0.7)
Eosinophils Relative: 2 %
HCT: 45.3 % (ref 39.0–52.0)
HEMOGLOBIN: 14.8 g/dL (ref 13.0–17.0)
LYMPHS ABS: 2.6 10*3/uL (ref 0.7–4.0)
LYMPHS PCT: 27 %
MCH: 29.8 pg (ref 26.0–34.0)
MCHC: 32.7 g/dL (ref 30.0–36.0)
MCV: 91.1 fL (ref 78.0–100.0)
Monocytes Absolute: 0.7 10*3/uL (ref 0.1–1.0)
Monocytes Relative: 7 %
NEUTROS ABS: 6.2 10*3/uL (ref 1.7–7.7)
NEUTROS PCT: 64 %
Platelets: 308 10*3/uL (ref 150–400)
RBC: 4.97 MIL/uL (ref 4.22–5.81)
RDW: 13.9 % (ref 11.5–15.5)
WBC: 9.7 10*3/uL (ref 4.0–10.5)

## 2016-06-29 LAB — I-STAT CHEM 8, ED
BUN: 29 mg/dL — AB (ref 6–20)
CALCIUM ION: 1.18 mmol/L (ref 1.15–1.40)
CHLORIDE: 103 mmol/L (ref 101–111)
Creatinine, Ser: 1.1 mg/dL (ref 0.61–1.24)
GLUCOSE: 118 mg/dL — AB (ref 65–99)
HCT: 46 % (ref 39.0–52.0)
Hemoglobin: 15.6 g/dL (ref 13.0–17.0)
Potassium: 4.5 mmol/L (ref 3.5–5.1)
Sodium: 137 mmol/L (ref 135–145)
TCO2: 27 mmol/L (ref 0–100)

## 2016-06-29 LAB — URINALYSIS, ROUTINE W REFLEX MICROSCOPIC
BACTERIA UA: NONE SEEN
BILIRUBIN URINE: NEGATIVE
Glucose, UA: NEGATIVE mg/dL
Ketones, ur: NEGATIVE mg/dL
Leukocytes, UA: NEGATIVE
NITRITE: NEGATIVE
PROTEIN: NEGATIVE mg/dL
SPECIFIC GRAVITY, URINE: 1.012 (ref 1.005–1.030)
pH: 5 (ref 5.0–8.0)

## 2016-06-29 MED ORDER — IBUPROFEN 600 MG PO TABS: 600.0000 mg | ORAL_TABLET | Freq: Four times a day (QID) | ORAL | 0 refills | Status: AC | PRN

## 2016-06-29 MED ORDER — SODIUM CHLORIDE 0.9 % IV BOLUS (SEPSIS): 1000.0000 mL | Freq: Once | INTRAVENOUS | Status: DC

## 2016-06-29 MED ORDER — KETOROLAC TROMETHAMINE 30 MG/ML IJ SOLN
30.0000 mg | Freq: Once | INTRAMUSCULAR | Status: AC
  Administered 2016-06-29: 30 mg via INTRAVENOUS
  Filled 2016-06-29: qty 1

## 2016-06-29 MED ORDER — HYDROCODONE-ACETAMINOPHEN 5-325 MG PO TABS: 1.0000 | ORAL_TABLET | Freq: Four times a day (QID) | ORAL | 0 refills | Status: AC | PRN

## 2016-06-29 MED ORDER — ONDANSETRON HCL 4 MG/2ML IJ SOLN
4.0000 mg | Freq: Once | INTRAMUSCULAR | Status: AC
  Administered 2016-06-29: 4 mg via INTRAVENOUS
  Filled 2016-06-29: qty 2

## 2016-06-29 NOTE — ED Provider Notes (Signed)
[facility]-EMERGENCY DEPT Provider Note   CSN: [id number] Arrival date & time: 06/29/16  4098     History   Chief Complaint Chief Complaint  Patient presents with  . Flank Pain    HPI Richard Dillon is a 36 y.o. male.  HPI [name] is a 36 y.o. male with history of polysubstance abuse and kidney stones, presents to emergency department complaining of right flank pain. Patient states that his pain started 1 week ago. Pain is intermittent. Describes a sharp, radiates in the right lower quadrant. Reports intermittent hematuria. States he does not feel like this is a kidney stone pain. He denies taking any medications to help his pain. Reports some nausea and one episode of vomiting. Denies any diarrhea. Stools are normal color. No fever or chills. No dysuria, urinary frequency or urgency.  Past Medical History:  Diagnosis Date  . Alcoholism (HCC)   . Drug abuse   . Kidney calculus   . Psoriasis   . Psoriasis     There are no active problems to display for this patient.   History reviewed. No pertinent surgical history.     Home Medications    Prior to Admission medications   Medication Sig Start Date End Date Taking? Authorizing Provider  cyclobenzaprine (FLEXERIL) 10 MG tablet Take 1 tablet (10 mg total) by mouth every 8 (eight) hours as needed for muscle spasms. 06/07/15   Charmayne Sheer Beers, PA-C  ibuprofen (ADVIL,MOTRIN) 800 MG tablet Take 1 tablet (800 mg total) by mouth every 8 (eight) hours as needed. 08/12/15   Governor Rooks, MD  ondansetron (ZOFRAN) 4 MG tablet Take 1 tablet (4 mg total) by mouth every 8 (eight) hours as needed for nausea or vomiting. 08/12/15   Governor Rooks, MD  oxyCODONE-acetaminophen (ROXICET) 5-325 MG tablet Take 1 tablet by mouth every 4 (four) hours as needed for severe pain. 08/12/15   Governor Rooks, MD  tamsulosin (FLOMAX) 0.4 MG CAPS capsule Take 1 capsule (0.4 mg total) by mouth daily. 08/12/15   Governor Rooks, MD    Family  History History reviewed. No pertinent family history.  Social History Social History  Substance Use Topics  . Smoking status: Current Every Day Smoker    Packs/day: 1.00    Types: Cigarettes  . Smokeless tobacco: Never Used  . Alcohol use No     Allergies   Amoxicillin and Prednisone   Review of Systems Review of Systems  Constitutional: Negative for chills and fever.  Respiratory: Negative for cough, chest tightness and shortness of breath.   Cardiovascular: Negative for chest pain, palpitations and leg swelling.  Gastrointestinal: Positive for abdominal pain, nausea and vomiting. Negative for abdominal distention and diarrhea.  Genitourinary: Positive for flank pain and hematuria. Negative for dysuria, frequency and urgency.  Musculoskeletal: Negative for arthralgias, myalgias, neck pain and neck stiffness.  Skin: Negative for rash.  Allergic/Immunologic: Negative for immunocompromised state.  Neurological: Negative for dizziness, weakness, light-headedness, numbness and headaches.  All other systems reviewed and are negative.    Physical Exam Updated Vital Signs BP 121/67 (BP Location: Left Arm)   Pulse 90   Temp 97.6 F (36.4 C) (Oral)   Resp 18   SpO2 99%   Physical Exam  Constitutional: He appears well-developed and well-nourished. No distress.  HENT:  Head: Normocephalic and atraumatic.  Eyes: Conjunctivae are normal.  Neck: Neck supple.  Cardiovascular: Normal rate, regular rhythm and normal heart sounds.   Pulmonary/Chest: Effort normal. No respiratory distress. He  has no wheezes. He has no rales.  Abdominal: Soft. Bowel sounds are normal. He exhibits no distension. There is tenderness. There is no rebound.  Right CVA and right upper and mid right quadrant tenderness  Musculoskeletal: He exhibits no edema.  Neurological: He is alert.  Skin: Skin is warm and dry.  Nursing note and vitals reviewed.    ED Treatments / Results  Labs (all labs ordered  are listed, but only abnormal results are displayed) Labs Reviewed  URINALYSIS, ROUTINE W REFLEX MICROSCOPIC - Abnormal; Notable for the following:       Result Value   Color, Urine STRAW (*)    Hgb urine dipstick MODERATE (*)    Squamous Epithelial / LPF 0-5 (*)    All other components within normal limits  I-STAT CHEM 8, ED - Abnormal; Notable for the following:    BUN 29 (*)    Glucose, Bld 118 (*)    All other components within normal limits  CBC WITH DIFFERENTIAL/PLATELET    EKG  EKG Interpretation None       Radiology Ct Renal Stone Study  Result Date: 06/29/2016 CLINICAL DATA:  Right flank pain and hematuria. EXAM: CT ABDOMEN AND PELVIS WITHOUT CONTRAST TECHNIQUE: Multidetector CT imaging of the abdomen and pelvis was performed following the standard protocol without IV contrast. COMPARISON:  CT scan 08/12/2015 FINDINGS: Lower chest: The lung bases are clear of acute process. No pleural effusion or pulmonary lesions. The heart is normal in size. No pericardial effusion. The distal esophagus and aorta are unremarkable. Hepatobiliary: No focal hepatic lesions or intrahepatic biliary dilatation. The gallbladder is normal. No common bile duct dilatation. Pancreas: No mass, inflammation or ductal dilatation. Spleen: Normal size.  No focal lesions. Adrenals/Urinary Tract: The adrenal glands are normal in stable. Multiple bilateral renal calculi are again demonstrated. No hydronephrosis or worrisome renal lesion. There are 2 calculi noted in the mid left ureter. The more cephalad calculus measures 4.5 mm and is located at the L4 level. The more caudal calculus measures 3.5 mm and is located at the L4-5 disc space level. No significant hydroureter. No right-sided renal calculi or bladder calculi. Stomach/Bowel: The stomach, duodenum, small bowel and colon are grossly normal without oral contrast. No inflammatory changes, mass lesions or obstructive findings. The terminal ileum and appendix are  normal. Vascular/Lymphatic: The aorta is normal in caliber. No mesenteric or retroperitoneal mass or adenopathy. Small scattered lymph nodes are noted. Reproductive: The prostate gland and seminal vesicles are normal. Other: No pelvic mass or adenopathy. No free pelvic fluid collections. No inguinal mass or adenopathy. No abdominal wall hernia or subcutaneous lesions. Musculoskeletal: No significant bony findings. IMPRESSION: 1. Multiple bilateral renal calculi. 2. Two nonobstructing calculi in the left mid ureter as discussed above. 3. No renal or bladder lesions are identified. 4. No acute abdominal/pelvic findings otherwise. Electronically Signed   By: Rudie Meyer M.D.   On: 06/29/2016 10:37    Procedures Procedures (including critical care time)  Medications Ordered in ED Medications - No data to display   Initial Impression / Assessment and Plan / ED Course  I have reviewed the triage vital signs and the nursing notes.  Pertinent labs & imaging results that were available during my care of the patient were reviewed by me and considered in my medical decision making (see chart for details).     Pt in ED with right flank pain and hematuria, intermittent for a week. Pt in NAD. Will check labs, UA,  ct renal. toradol and zofran ordered for symptoms.   10:50 AM CT with multiple bilateral renal stones. No obstructing stone. No other acute findings on CT abd/pelvis. UA showing hematuria, no signs of infection. Plan to dc home. Question if pt passed a stone prior to coming in which would explain severe pain.  Ibuprofen for pain. Will give 6 norco tabs for severe pain only. Follow up with urology.   Vitals:   06/29/16 0740 06/29/16 0911 06/29/16 1119  BP: 129/73 121/67 109/63  Pulse: 103 90 89  Resp: 19 18 18   Temp: 98.9 F (37.2 C) 97.6 F (36.4 C) 98.4 F (36.9 C)  TempSrc: Oral Oral Oral  SpO2: 100% 99% 99%     Final Clinical Impressions(s) / ED Diagnoses   Final diagnoses:    Right flank pain    New Prescriptions Discharge Medication List as of 06/29/2016 11:14 AM    START taking these medications   Details  HYDROcodone-acetaminophen (NORCO) 5-325 MG tablet Take 1 tablet by mouth every 6 (six) hours as needed for moderate pain., Starting Thu 06/29/2016, Print         Jaynie Crumbleatyana Judee Hennick, PA-C 06/29/16 1331    Glynn OctaveStephen Rancour, MD 06/29/16 2039

## 2016-06-29 NOTE — Discharge Instructions (Signed)
Take ibuprofen for pain as prescribed. Take norco for severe pain only. Please follow up with urology

## 2016-06-29 NOTE — ED Triage Notes (Signed)
Pt sts right flank pain x 1 week; pt sts hematuria

## 2018-05-11 IMAGING — CT CT RENAL STONE PROTOCOL
2 of 4 series · 15 of 46 positions shown, 17 images · non-contrast
Comparison: CT scan 08/12/2015

CLINICAL DATA: Right flank pain and hematuria.

EXAM:
CT ABDOMEN AND PELVIS WITHOUT CONTRAST
TECHNIQUE: Multidetector CT imaging of the abdomen and pelvis was performed
following the standard protocol without IV contrast.

[Series 2: renal stone 5.0 · axial · 0.71mm/px · z∈[+746,+1182]mm · 12 of 103 slices shown, 14 images]
[im 8/103  soft-tissue]
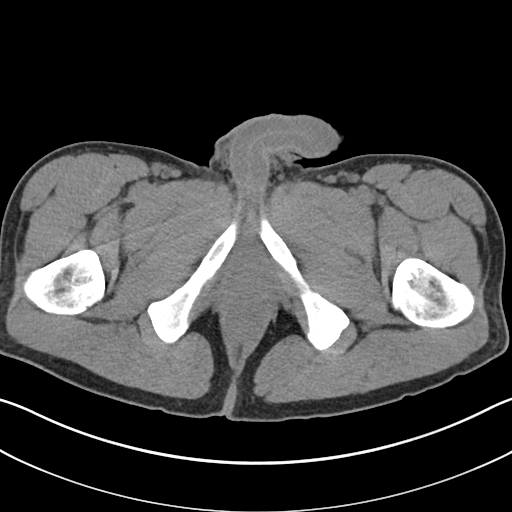
[im 8/103  bone]
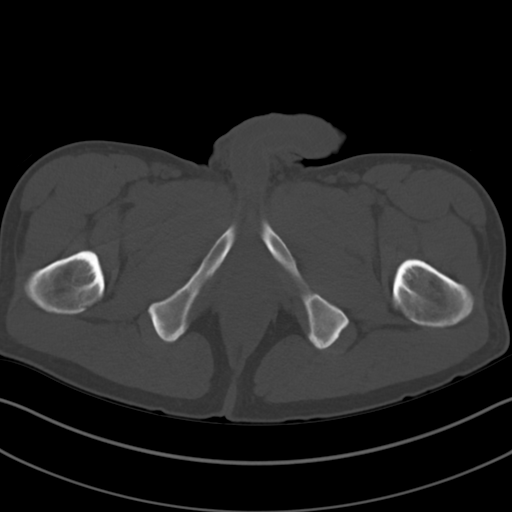
[im 16/103  soft-tissue]
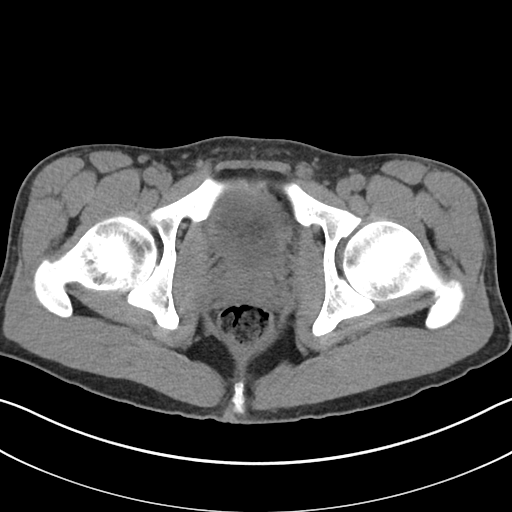
[im 24/103  soft-tissue]
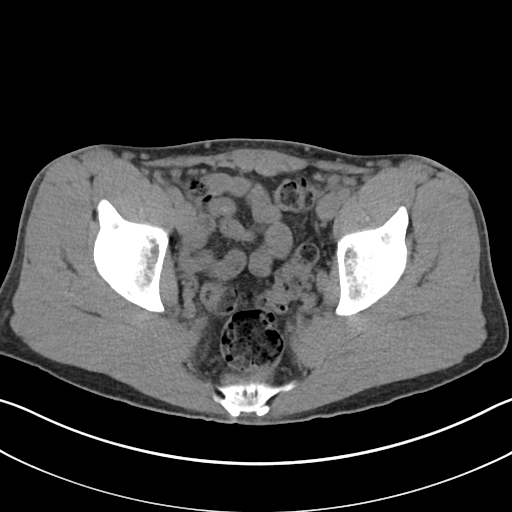
[im 32/103  soft-tissue]
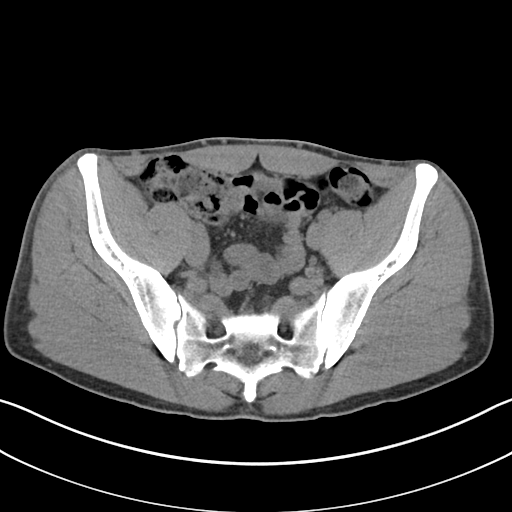
[im 40/103  soft-tissue]
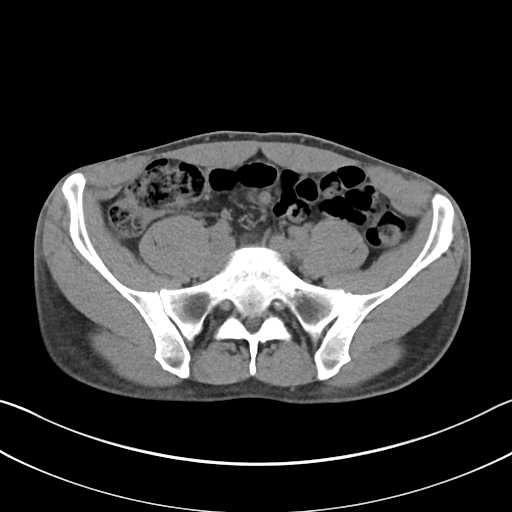
[im 48/103  soft-tissue]
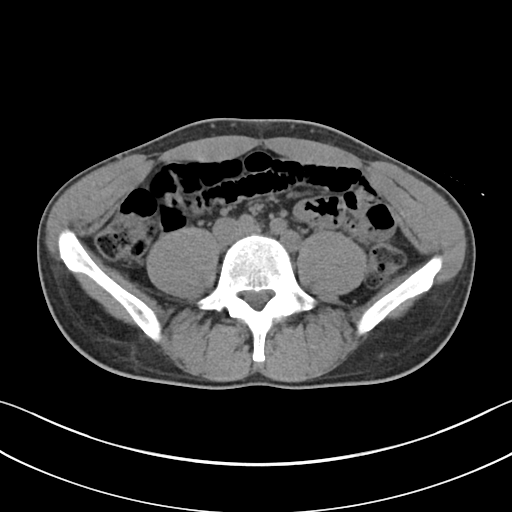
[im 55/103  soft-tissue]
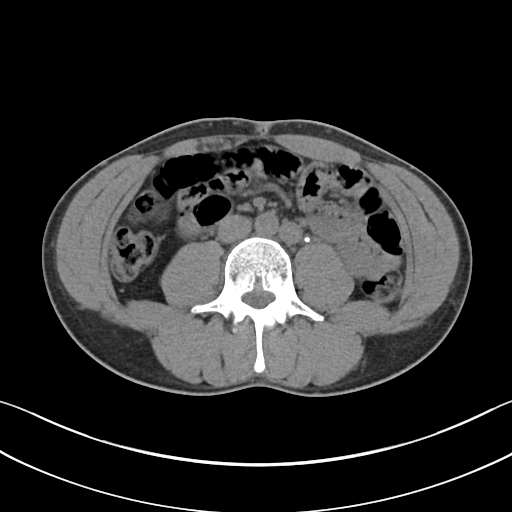
[im 63/103  soft-tissue]
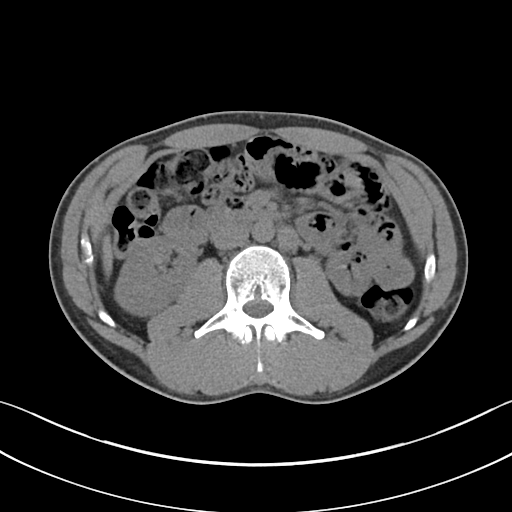
[im 71/103  soft-tissue]
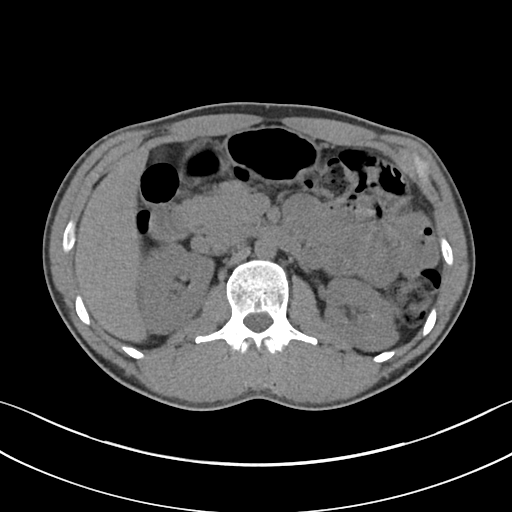
[im 71/103  bone]
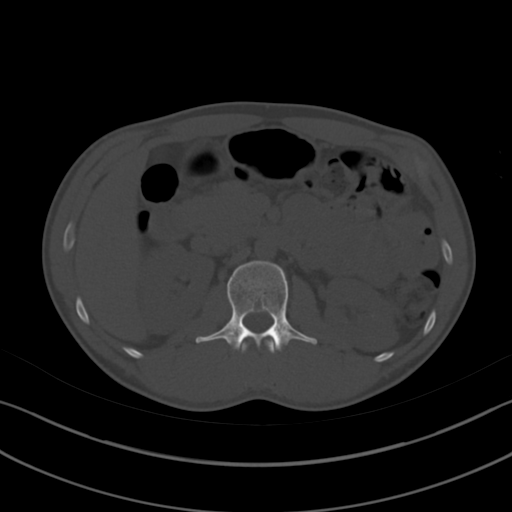
[im 79/103  soft-tissue]
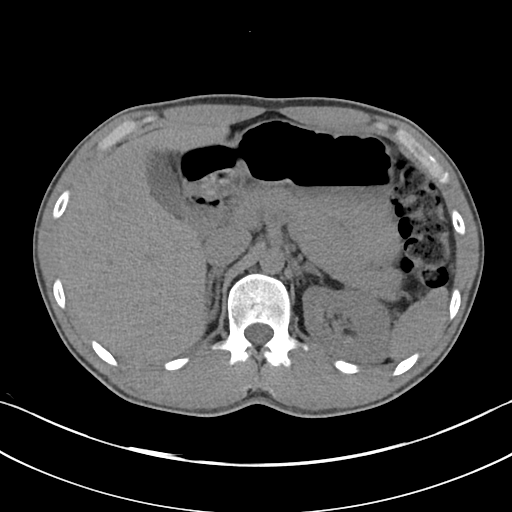
[im 87/103  soft-tissue]
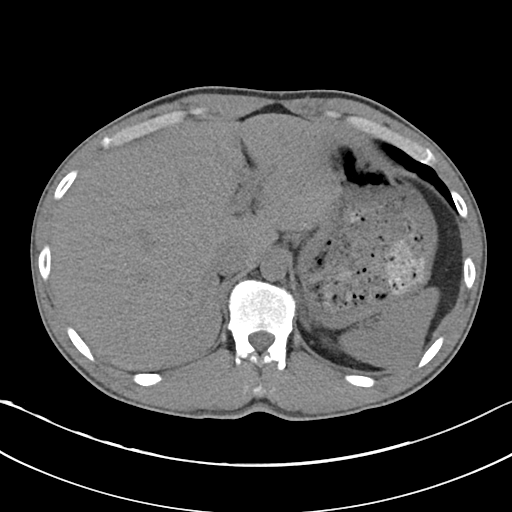
[im 95/103  soft-tissue]
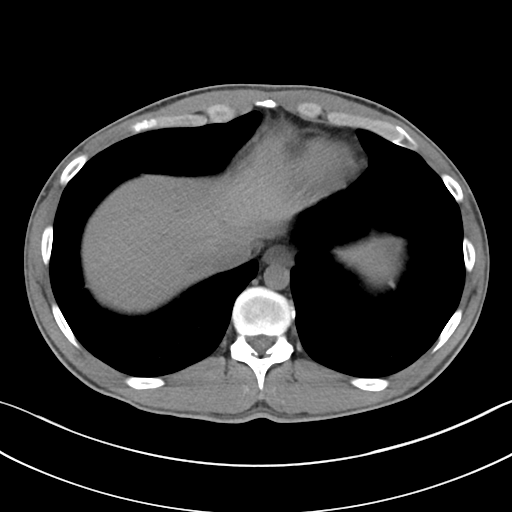

[Series 5: renal stone 3.0 cor · coronal · 0.67mm/px · 3 of 86 slices shown]
[im 29/86  soft-tissue]
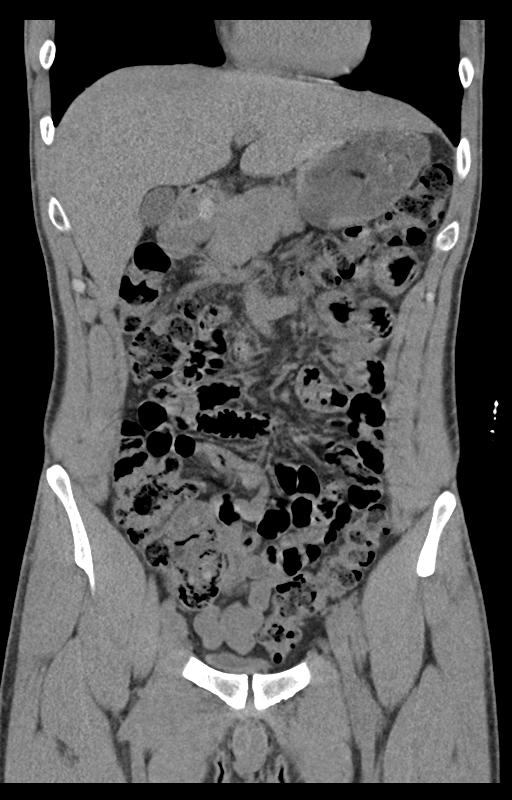
[im 38/86  soft-tissue]
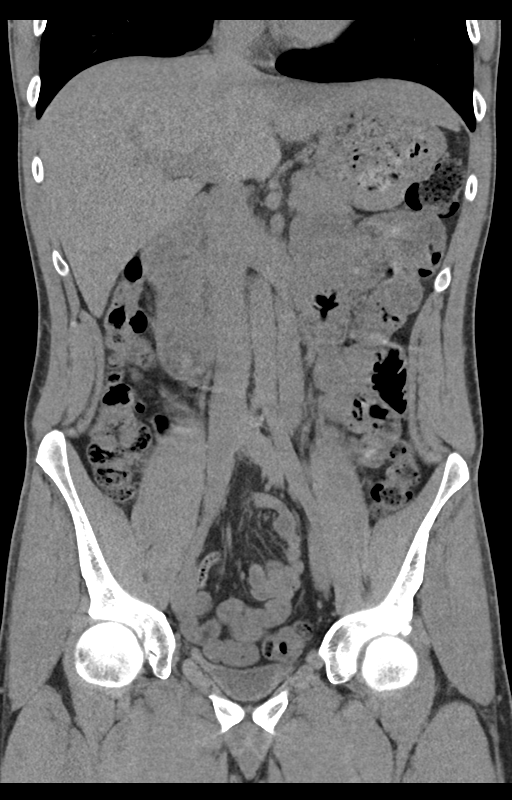
[im 48/86  soft-tissue]
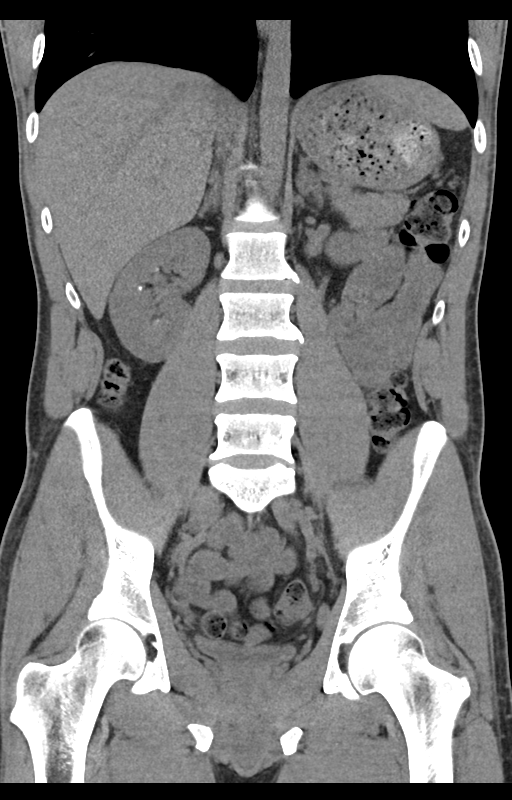

[15 of 46 positions shown; findings below may reference images not displayed]

FINDINGS: Lower chest: The lung bases are clear of acute process. No pleural
effusion or pulmonary lesions. The heart is normal in size. No
pericardial effusion. The distal esophagus and aorta are
unremarkable.

Hepatobiliary: No focal hepatic lesions or intrahepatic biliary
dilatation. The gallbladder is normal. No common bile duct
dilatation.

Pancreas: No mass, inflammation or ductal dilatation.

Spleen: Normal size.  No focal lesions.

Adrenals/Urinary Tract: The adrenal glands are normal in stable.

Multiple bilateral renal calculi are again demonstrated. No
hydronephrosis or worrisome renal lesion. There are 2 calculi noted
in the mid left ureter. The more cephalad calculus measures 4.5 mm
and is located at the L4 level. The more caudal calculus measures
3.5 mm and is located at the L4-5 disc space level. No significant
hydroureter. No right-sided renal calculi or bladder calculi.

Stomach/Bowel: The stomach, duodenum, small bowel and colon are
grossly normal without oral contrast. No inflammatory changes, mass
lesions or obstructive findings. The terminal ileum and appendix are
normal.

Vascular/Lymphatic: The aorta is normal in caliber. No mesenteric or
retroperitoneal mass or adenopathy. Small scattered lymph nodes are
noted.

Reproductive: The prostate gland and seminal vesicles are normal.

Other: No pelvic mass or adenopathy. No free pelvic fluid
collections. No inguinal mass or adenopathy. No abdominal wall
hernia or subcutaneous lesions.

Musculoskeletal: No significant bony findings.
IMPRESSION: 1. Multiple bilateral renal calculi.
2. Two nonobstructing calculi in the left mid ureter as discussed
above.
3. No renal or bladder lesions are identified.
4. No acute abdominal/pelvic findings otherwise.
